# Patient Record
Sex: Female | Born: 1995 | Race: Black or African American | Hispanic: No | Marital: Single | Smoking: Never smoker
Health system: Southern US, Community
[De-identification: ages and names within clinical notes are randomized; demographics above are authoritative.]

## PROBLEM LIST (undated history)

## (undated) DIAGNOSIS — E669 Obesity, unspecified: Secondary | ICD-10-CM

## (undated) DIAGNOSIS — J020 Streptococcal pharyngitis: Secondary | ICD-10-CM

## (undated) HISTORY — DX: Obesity, unspecified: E66.9

---
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
## 2016-03-22 ENCOUNTER — Encounter: Admitting: Family Medicine

## 2016-03-30 ENCOUNTER — Encounter: Payer: Self-pay | Admitting: Family Medicine

## 2016-03-30 ENCOUNTER — Ambulatory Visit (INDEPENDENT_AMBULATORY_CARE_PROVIDER_SITE_OTHER): Admitting: Family Medicine

## 2016-03-30 VITALS — BP 122/88 | HR 76 | Temp 97.9°F | Ht 67.0 in | Wt 371.0 lb

## 2016-03-30 DIAGNOSIS — Z23 Encounter for immunization: Secondary | ICD-10-CM

## 2016-03-30 DIAGNOSIS — F329 Major depressive disorder, single episode, unspecified: Secondary | ICD-10-CM

## 2016-03-30 DIAGNOSIS — F32A Depression, unspecified: Secondary | ICD-10-CM

## 2016-03-30 DIAGNOSIS — Z0000 Encounter for general adult medical examination without abnormal findings: Secondary | ICD-10-CM | POA: Diagnosis not present

## 2016-03-30 DIAGNOSIS — Z114 Encounter for screening for human immunodeficiency virus [HIV]: Secondary | ICD-10-CM

## 2016-03-30 DIAGNOSIS — N946 Dysmenorrhea, unspecified: Secondary | ICD-10-CM

## 2016-03-30 DIAGNOSIS — Z803 Family history of malignant neoplasm of breast: Secondary | ICD-10-CM | POA: Diagnosis not present

## 2016-03-30 MED ORDER — VENLAFAXINE HCL 37.5 MG PO TABS: 37.5000 mg | ORAL_TABLET | Freq: Two times a day (BID) | ORAL | 5 refills | Status: DC

## 2016-03-30 NOTE — Patient Instructions (Addendum)
It was nice to meet you today!  On your way out, schedule an appointment one morning to come back for fasting labs. Do not eat or drink anything other than water the morning of your lab appointment until after your labs are drawn.  Will check cholesterol, liver, kidneys, HIV, and diabetes.  Follow up with me to talk about birth control options if you decide this is what you want.  Refilled effexor  Referring to genetic counselor to discuss your family history of cancer.  Be well, Dr. Ardelia Mems     Contraception Choices Contraception (birth control) is the use of any methods or devices to prevent pregnancy. Below are some methods to help avoid pregnancy. Hormonal methods  Contraceptive implant. This is a thin, plastic tube containing progesterone hormone. It does not contain estrogen hormone. Your health care provider inserts the tube in the inner part of the upper arm. The tube can remain in place for up to 3 years. After 3 years, the implant must be removed. The implant prevents the ovaries from releasing an egg (ovulation), thickens the cervical mucus to prevent sperm from entering the uterus, and thins the lining of the inside of the uterus.  Progesterone-only injections. These injections are given every 3 months by your health care provider to prevent pregnancy. This synthetic progesterone hormone stops the ovaries from releasing eggs. It also thickens cervical mucus and changes the uterine lining. This makes it harder for sperm to survive in the uterus.  Birth control pills. These pills contain estrogen and progesterone hormone. They work by preventing the ovaries from releasing eggs (ovulation). They also cause the cervical mucus to thicken, preventing the sperm from entering the uterus. Birth control pills are prescribed by a health care provider.Birth control pills can also be used to treat heavy periods.  Minipill. This type of birth control pill contains only the progesterone  hormone. They are taken every day of each month and must be prescribed by your health care provider.  Birth control patch. The patch contains hormones similar to those in birth control pills. It must be changed once a week and is prescribed by a health care provider.  Vaginal ring. The ring contains hormones similar to those in birth control pills. It is left in the vagina for 3 weeks, removed for 1 week, and then a new one is put back in place. The patient must be comfortable inserting and removing the ring from the vagina.A health care provider's prescription is necessary.  Emergency contraception. Emergency contraceptives prevent pregnancy after unprotected sexual intercourse. This pill can be taken right after sex or up to 5 days after unprotected sex. It is most effective the sooner you take the pills after having sexual intercourse. Most emergency contraceptive pills are available without a prescription. Check with your pharmacist. Do not use emergency contraception as your only form of birth control. Barrier methods  Female condom. This is a thin sheath (latex or rubber) that is worn over the penis during sexual intercourse. It can be used with spermicide to increase effectiveness.  Female condom. This is a soft, loose-fitting sheath that is put into the vagina before sexual intercourse.  Diaphragm. This is a soft, latex, dome-shaped barrier that must be fitted by a health care provider. It is inserted into the vagina, along with a spermicidal jelly. It is inserted before intercourse. The diaphragm should be left in the vagina for 6 to 8 hours after intercourse.  Cervical cap. This is a round, soft, latex  or plastic cup that fits over the cervix and must be fitted by a health care provider. The cap can be left in place for up to 48 hours after intercourse.  Sponge. This is a soft, circular piece of polyurethane foam. The sponge has spermicide in it. It is inserted into the vagina after wetting  it and before sexual intercourse.  Spermicides. These are chemicals that kill or block sperm from entering the cervix and uterus. They come in the form of creams, jellies, suppositories, foam, or tablets. They do not require a prescription. They are inserted into the vagina with an applicator before having sexual intercourse. The process must be repeated every time you have sexual intercourse. Intrauterine contraception  Intrauterine device (IUD). This is a T-shaped device that is put in a woman's uterus during a menstrual period to prevent pregnancy. There are 2 types:  Copper IUD. This type of IUD is wrapped in copper wire and is placed inside the uterus. Copper makes the uterus and fallopian tubes produce a fluid that kills sperm. It can stay in place for 10 years.  Hormone IUD. This type of IUD contains the hormone progestin (synthetic progesterone). The hormone thickens the cervical mucus and prevents sperm from entering the uterus, and it also thins the uterine lining to prevent implantation of a fertilized egg. The hormone can weaken or kill the sperm that get into the uterus. It can stay in place for 3-5 years, depending on which type of IUD is used. Permanent methods of contraception  Female tubal ligation. This is when the woman's fallopian tubes are surgically sealed, tied, or blocked to prevent the egg from traveling to the uterus.  Hysteroscopic sterilization. This involves placing a small coil or insert into each fallopian tube. Your doctor uses a technique called hysteroscopy to do the procedure. The device causes scar tissue to form. This results in permanent blockage of the fallopian tubes, so the sperm cannot fertilize the egg. It takes about 3 months after the procedure for the tubes to become blocked. You must use another form of birth control for these 3 months.  Female sterilization. This is when the female has the tubes that carry sperm tied off (vasectomy).This blocks sperm from  entering the vagina during sexual intercourse. After the procedure, the man can still ejaculate fluid (semen). Natural planning methods  Natural family planning. This is not having sexual intercourse or using a barrier method (condom, diaphragm, cervical cap) on days the woman could become pregnant.  Calendar method. This is keeping track of the length of each menstrual cycle and identifying when you are fertile.  Ovulation method. This is avoiding sexual intercourse during ovulation.  Symptothermal method. This is avoiding sexual intercourse during ovulation, using a thermometer and ovulation symptoms.  Post-ovulation method. This is timing sexual intercourse after you have ovulated. Regardless of which type or method of contraception you choose, it is important that you use condoms to protect against the transmission of sexually transmitted infections (STIs). Talk with your health care provider about which form of contraception is most appropriate for you. This information is not intended to replace advice given to you by your health care provider. Make sure you discuss any questions you have with your health care provider. Document Released: 02/22/2005 Document Revised: 07/31/2015 Document Reviewed: 08/17/2012 Elsevier Interactive Patient Education  2017 [place] Maintenance, Female Introduction Adopting a healthy lifestyle and getting preventive care can go a long way to promote health and wellness.  Talk with your health care provider about what schedule of regular examinations is right for you. This is a good chance for you to check in with your provider about disease prevention and staying healthy. In between checkups, there are plenty of things you can do on your own. Experts have done a lot of research about which lifestyle changes and preventive measures are most likely to keep you healthy. Ask your health care provider for more information. Weight and diet Eat a  healthy diet  Be sure to include plenty of vegetables, fruits, low-fat dairy products, and lean protein.  Do not eat a lot of foods high in solid fats, added sugars, or salt.  Get regular exercise. This is one of the most important things you can do for your health.  Most adults should exercise for at least 150 minutes each week. The exercise should increase your heart rate and make you sweat (moderate-intensity exercise).  Most adults should also do strengthening exercises at least twice a week. This is in addition to the moderate-intensity exercise. Maintain a healthy weight  Body mass index (BMI) is a measurement that can be used to identify possible weight problems. It estimates body fat based on height and weight. Your health care provider can help determine your BMI and help you achieve or maintain a healthy weight.  For females 52 years of age and older:  A BMI below 18.5 is considered underweight.  A BMI of 18.5 to 24.9 is normal.  A BMI of 25 to 29.9 is considered overweight.  A BMI of 30 and above is considered obese. Watch levels of cholesterol and blood lipids  You should start having your blood tested for lipids and cholesterol at 20 years of age, then have this test every 5 years.  You may need to have your cholesterol levels checked more often if:  Your lipid or cholesterol levels are high.  You are older than 20 years of age.  You are at high risk for heart disease. Cancer screening Lung Cancer  Lung cancer screening is recommended for adults 16-4 years old who are at high risk for lung cancer because of a history of smoking.  A yearly low-dose CT scan of the lungs is recommended for people who:  Currently smoke.  Have quit within the past 15 years.  Have at least a 30-pack-year history of smoking. A pack year is smoking an average of one pack of cigarettes a day for 1 year.  Yearly screening should continue until it has been 15 years since you  quit.  Yearly screening should stop if you develop a health problem that would prevent you from having lung cancer treatment. Breast Cancer  Practice breast self-awareness. This means understanding how your breasts normally appear and feel.  It also means doing regular breast self-exams. Let your health care provider know about any changes, no matter how small.  If you are in your 20s or 30s, you should have a clinical breast exam (CBE) by a health care provider every 1-3 years as part of a regular health exam.  If you are 89 or older, have a CBE every year. Also consider having a breast X-ray (mammogram) every year.  If you have a family history of breast cancer, talk to your health care provider about genetic screening.  If you are at high risk for breast cancer, talk to your health care provider about having an MRI and a mammogram every year.  Breast cancer gene (BRCA) assessment  is recommended for women who have family members with BRCA-related cancers. BRCA-related cancers include:  Breast.  Ovarian.  Tubal.  Peritoneal cancers.  Results of the assessment will determine the need for genetic counseling and BRCA1 and BRCA2 testing. Cervical Cancer  Your health care provider may recommend that you be screened regularly for cancer of the pelvic organs (ovaries, uterus, and vagina). This screening involves a pelvic examination, including checking for microscopic changes to the surface of your cervix (Pap test). You may be encouraged to have this screening done every 3 years, beginning at age 80.  For women ages 25-65, health care providers may recommend pelvic exams and Pap testing every 3 years, or they may recommend the Pap and pelvic exam, combined with testing for human papilloma virus (HPV), every 5 years. Some types of HPV increase your risk of cervical cancer. Testing for HPV may also be done on women of any age with unclear Pap test results.  Other health care providers may  not recommend any screening for nonpregnant women who are considered low risk for pelvic cancer and who do not have symptoms. Ask your health care provider if a screening pelvic exam is right for you.  If you have had past treatment for cervical cancer or a condition that could lead to cancer, you need Pap tests and screening for cancer for at least 20 years after your treatment. If Pap tests have been discontinued, your risk factors (such as having a new sexual partner) need to be reassessed to determine if screening should resume. Some women have medical problems that increase the chance of getting cervical cancer. In these cases, your health care provider may recommend more frequent screening and Pap tests. Colorectal Cancer  This type of cancer can be detected and often prevented.  Routine colorectal cancer screening usually begins at 20 years of age and continues through 20 years of age.  Your health care provider may recommend screening at an earlier age if you have risk factors for colon cancer.  Your health care provider may also recommend using home test kits to check for hidden blood in the stool.  A small camera at the end of a tube can be used to examine your colon directly (sigmoidoscopy or colonoscopy). This is done to check for the earliest forms of colorectal cancer.  Routine screening usually begins at age 57.  Direct examination of the colon should be repeated every 5-10 years through 20 years of age. However, you may need to be screened more often if early forms of precancerous polyps or small growths are found. Skin Cancer  Check your skin from head to toe regularly.  Tell your health care provider about any new moles or changes in moles, especially if there is a change in a mole's shape or color.  Also tell your health care provider if you have a mole that is larger than the size of a pencil eraser.  Always use sunscreen. Apply sunscreen liberally and repeatedly  throughout the day.  Protect yourself by wearing long sleeves, pants, a wide-brimmed hat, and sunglasses whenever you are outside. Heart disease, diabetes, and high blood pressure  High blood pressure causes heart disease and increases the risk of stroke. High blood pressure is more likely to develop in:  People who have blood pressure in the high end of the normal range (130-139/85-89 mm Hg).  People who are overweight or obese.  People who are African American.  If you are 79-32 years of age,  have your blood pressure checked every 3-5 years. If you are 21 years of age or older, have your blood pressure checked every year. You should have your blood pressure measured twice-once when you are at a hospital or clinic, and once when you are not at a hospital or clinic. Record the average of the two measurements. To check your blood pressure when you are not at a hospital or clinic, you can use:  An automated blood pressure machine at a pharmacy.  A home blood pressure monitor.  If you are between 18 years and 53 years old, ask your health care provider if you should take aspirin to prevent strokes.  Have regular diabetes screenings. This involves taking a blood sample to check your fasting blood sugar level.  If you are at a normal weight and have a low risk for diabetes, have this test once every three years after 20 years of age.  If you are overweight and have a high risk for diabetes, consider being tested at a younger age or more often. Preventing infection Hepatitis B  If you have a higher risk for hepatitis B, you should be screened for this virus. You are considered at high risk for hepatitis B if:  You were born in a country where hepatitis B is common. Ask your health care provider which countries are considered high risk.  Your parents were born in a high-risk country, and you have not been immunized against hepatitis B (hepatitis B vaccine).  You have HIV or AIDS.  You  use needles to inject street drugs.  You live with someone who has hepatitis B.  You have had sex with someone who has hepatitis B.  You get hemodialysis treatment.  You take certain medicines for conditions, including cancer, organ transplantation, and autoimmune conditions. Hepatitis C  Blood testing is recommended for:  Everyone born from [birth year] through 1964.  Anyone with known risk factors for hepatitis C. Sexually transmitted infections (STIs)  You should be screened for sexually transmitted infections (STIs) including gonorrhea and chlamydia if:  You are sexually active and are younger than 20 years of age.  You are older than 20 years of age and your health care provider tells you that you are at risk for this type of infection.  Your sexual activity has changed since you were last screened and you are at an increased risk for chlamydia or gonorrhea. Ask your health care provider if you are at risk.  If you do not have HIV, but are at risk, it may be recommended that you take a prescription medicine daily to prevent HIV infection. This is called pre-exposure prophylaxis (PrEP). You are considered at risk if:  You are sexually active and do not regularly use condoms or know the HIV status of your partner(s).  You take drugs by injection.  You are sexually active with a partner who has HIV. Talk with your health care provider about whether you are at high risk of being infected with HIV. If you choose to begin PrEP, you should first be tested for HIV. You should then be tested every 3 months for as long as you are taking PrEP. Pregnancy  If you are premenopausal and you may become pregnant, ask your health care provider about preconception counseling.  If you may become pregnant, take 400 to 800 micrograms (mcg) of folic acid every day.  If you want to prevent pregnancy, talk to your health care provider about birth control (contraception). Osteoporosis and  menopause  Osteoporosis is a disease in which the bones lose minerals and strength with aging. This can result in serious bone fractures. Your risk for osteoporosis can be identified using a bone density scan.  If you are 23 years of age or older, or if you are at risk for osteoporosis and fractures, ask your health care provider if you should be screened.  Ask your health care provider whether you should take a calcium or vitamin D supplement to lower your risk for osteoporosis.  Menopause may have certain physical symptoms and risks.  Hormone replacement therapy may reduce some of these symptoms and risks. Talk to your health care provider about whether hormone replacement therapy is right for you. Follow these instructions at home:  Schedule regular health, dental, and eye exams.  Stay current with your immunizations.  Do not use any tobacco products including cigarettes, chewing tobacco, or electronic cigarettes.  If you are pregnant, do not drink alcohol.  If you are breastfeeding, limit how much and how often you drink alcohol.  Limit alcohol intake to no more than 1 drink per day for nonpregnant women. One drink equals 12 ounces of beer, 5 ounces of wine, or 1 ounces of hard liquor.  Do not use street drugs.  Do not share needles.  Ask your health care provider for help if you need support or information about quitting drugs.  Tell your health care provider if you often feel depressed.  Tell your health care provider if you have ever been abused or do not feel safe at home. This information is not intended to replace advice given to you by your health care provider. Make sure you discuss any questions you have with your health care provider. Document Released: 09/07/2010 Document Revised: 07/31/2015 Document Reviewed: 11/26/2014  2017 Elsevier

## 2016-03-30 NOTE — Progress Notes (Signed)
Date of Visit: 03/30/2016   HPI:  Patient presents today for a well woman exam. This is my first time meeting her, as she recently was reassigned to me due to changes in Dr. Tyson AliasWalden's availability.  Concerns today: menstrual cramps see below Periods: monthly periods, lasts 5-7 days on average, bad cramps. Has tried midol. Currently on day 3 of period. Wants to see how midol does for this cycle. May want to return to discuss birth control options to help with cramps. Contraception: abstinent, denies any history of sexual activity Pelvic symptoms: no pain (except menstrual cramps) or discharge Sexual activity: no sex - not planning on being sexually active at this time STD Screening: declines STD testing, but ok with routine HIV screening with labs Pap smear status: not indicated due to age Exercise: walks a lot at work Diet: working on healthy eating Smoking: none Alcohol: none Drugs: none Mood: see below Dentist: has not been recently, anticipates she will get dental coverage in the fall and will go then  Depression - history of this, taking venlafaxine 37.5mg  twice daily (apparently her pharmacy gave her multiple bottles of this, despite Dr. Gwendolyn GrantWalden only prescribing 1 month with 1 refill). Has been compliant with taking this. Feels it is helping greatly. No SI/HI. Happy with current dose.   ROS: See HPI  PMFSH:  Cancers in family:  Mom - breast cancer, died when patient was age 656 Maternal grandmother - ovarian Paternal grandmother - breast Patient unsure if any genetic testing was done on her family members. She is interested in seeing a Dentistgenetic counselor.  PHYSICAL EXAM: BP 122/88   Pulse 76   Temp 97.9 F (36.6 C) (Oral)   Ht 5\' 7"  (1.702 m)   Wt (!) 371 lb (168.3 kg)   LMP 03/27/2016   BMI 58.11 kg/m  Gen: NAD, pleasant, cooperative HEENT: NCAT, PERRL, no palpable thyromegaly or anterior cervical lymphadenopathy Heart: RRR, no murmurs Lungs: CTAB, NWOB Abdomen: soft,  nontender to palpation Neuro: grossly nonfocal, speech normal Extremities: No appreciable lower extremity edema bilaterally  Psych: normal range of affect, well groomed, speech normal in rate and volume, normal eye contact   ASSESSMENT/PLAN:  # [place] maintenance:  -STD screening: declined. Check HIV antibody with labs -pap smear: not indicated (age 21) -lipid screening: return for fasting lipids & CMET, A1c given BMI -immunizations: flu shot given today  -handout given on health maintenance topics  Morbid obesity (HCC) Encouraged efforts at losing weight. Return for fasting lipids, CMET, A1c (hx of prediabetes noted on prior labs)  Depression Well controlled on venlafaxine. Continue current medication. Refilled today.  Family history of breast cancer Strong family history of breast & ovarian cancer. Will refer to genetic counseling.  Menstrual cramps after visit summary with handout on contraception options (pt declined actual printed copy, preferred to access via  Mychart) Continue midol Follow up if she decides she wants to do birth control to manage cramps   FOLLOW UP: Follow up in several days for lab visit Referring to genetic counselor  [place]renadaBrittany J. Pollie MeyerMcIntyre, MD [facility]

## 2016-03-31 DIAGNOSIS — N946 Dysmenorrhea, unspecified: Secondary | ICD-10-CM | POA: Insufficient documentation

## 2016-03-31 DIAGNOSIS — Z803 Family history of malignant neoplasm of breast: Secondary | ICD-10-CM | POA: Insufficient documentation

## 2016-03-31 NOTE — Assessment & Plan Note (Signed)
Well controlled on venlafaxine. Continue current medication. Refilled today.

## 2016-03-31 NOTE — Assessment & Plan Note (Signed)
after visit summary with handout on contraception options (pt declined actual printed copy, preferred to access via  Mychart) Continue midol Follow up if she decides she wants to do birth control to manage cramps

## 2016-03-31 NOTE — Assessment & Plan Note (Signed)
Encouraged efforts at losing weight. Return for fasting lipids, CMET, A1c (hx of prediabetes noted on prior labs)

## 2016-03-31 NOTE — Assessment & Plan Note (Signed)
Strong family history of breast & ovarian cancer. Will refer to genetic counseling.

## 2016-04-08 ENCOUNTER — Encounter: Payer: Self-pay | Admitting: Genetic Counselor

## 2016-04-08 ENCOUNTER — Telehealth: Payer: Self-pay | Admitting: Genetic Counselor

## 2016-04-08 NOTE — Telephone Encounter (Signed)
Appt has been scheduled for the pt to see a genetic counselor on 2/23 at 930am. Left appt date and time with the pt's grandmother to inform the pt. Letter mailed.

## 2016-04-27 ENCOUNTER — Encounter

## 2016-04-27 ENCOUNTER — Other Ambulatory Visit

## 2016-06-01 ENCOUNTER — Ambulatory Visit: Admitting: Family Medicine

## 2016-06-09 ENCOUNTER — Ambulatory Visit (INDEPENDENT_AMBULATORY_CARE_PROVIDER_SITE_OTHER): Admitting: Family Medicine

## 2016-06-09 ENCOUNTER — Encounter: Payer: Self-pay | Admitting: Family Medicine

## 2016-06-09 DIAGNOSIS — K529 Noninfective gastroenteritis and colitis, unspecified: Secondary | ICD-10-CM | POA: Insufficient documentation

## 2016-06-09 MED ORDER — ONDANSETRON 4 MG PO TBDP: 4.0000 mg | ORAL_TABLET | Freq: Three times a day (TID) | ORAL | 0 refills | Status: DC | PRN

## 2016-06-09 NOTE — Patient Instructions (Signed)
I am not sure whether this is a stomach virus or food poisoning.  The treatment is the same.  I would assume you are contagious. I sent in a nausea med if you need it. Lots of fluids.   OK to use small doses of Immodium. See [place] if worse, bleeding.

## 2016-06-09 NOTE — Assessment & Plan Note (Signed)
Unclear if food poisoning or infectious.  No red flag symptoms.  Symptomatic treatment.

## 2016-06-09 NOTE — Progress Notes (Signed)
   Subjective:    Patient ID: Kathy [name], female    DOB: Mar 02, 1996, 21 y.o.   MRN: [id number]  HPI 21 yo female with abrupt onset of nausea, vomiting and now diarrhea beginning ~12 hours ago.  She attributes to eating at [employer].  Grandmother had similar problems two days ago and also ate at the same place.  Denies any possibility of pregnancy.  "I am a virgin."  Vomiting has cleared.  Still nausea.  Very frequent (q15 min) watery diarrhea, no blood.  No fever or lightheadedness.  Only cramping Pain, not constant.    Review of Systems     Objective:   Physical Exam VS noted Non toxic appearance. Abd benign. Mild diffuse discomfort.        Assessment & Plan:

## 2018-10-31 ENCOUNTER — Other Ambulatory Visit: Payer: Self-pay

## 2018-10-31 ENCOUNTER — Ambulatory Visit (INDEPENDENT_AMBULATORY_CARE_PROVIDER_SITE_OTHER): Admitting: Family Medicine

## 2018-10-31 VITALS — BP 119/75 | HR 72 | Wt >= 6400 oz

## 2018-10-31 DIAGNOSIS — Z803 Family history of malignant neoplasm of breast: Secondary | ICD-10-CM

## 2018-10-31 DIAGNOSIS — Z8041 Family history of malignant neoplasm of ovary: Secondary | ICD-10-CM | POA: Diagnosis not present

## 2018-10-31 DIAGNOSIS — Z0000 Encounter for general adult medical examination without abnormal findings: Secondary | ICD-10-CM | POA: Diagnosis not present

## 2018-10-31 DIAGNOSIS — N946 Dysmenorrhea, unspecified: Secondary | ICD-10-CM

## 2018-10-31 NOTE — Progress Notes (Signed)
Date of Visit: 10/31/2018   CC: Physical   HPI:  Kathy [name] is a 23 year old female with history of menstrual cramps and obesity and presenting today for her annual physical. Her concerns today include menstrual cramps and weight loss.   She reports that her menstrual cramps have become more noticeable in the last 2 months. Her cycle is regular and predictable but she has noticed more bleeding and more cramping with her last two cycles. She is changing her pads every 2-3 hours. She is not soaking through them but feels comfortable changing them frequently. Midol has not been as helpful with the cramps. Denies nausea, vomiting, or headaches.     She reports losing 18 pounds in the last 2 months. Reports taking "Keto cleanse" and "Keto diet" pills she got online. She has also been exercising and eating healthier. She tried to avoid fast foods and tries to pick healthier options from fast foods. She reports just wanting to be healthier.   No recent changes in her mood or feelings of depression.  Contraception: abstinent Sexual activity: none ever, not anticipating will happen soon STD Screening: declines Pap smear status: will schedule at another time Exercise: been more active lately Diet: see above Smoking: no Alcohol: no Drugs: no  ROS: See HPI.  [place]: +family history of breast cancer in multiple female family members  PHYSICAL EXAM: BP 119/75   Pulse 72   Wt (!) 445 lb 9.6 oz (202.1 kg)   SpO2 99%   BMI 69.79 kg/m  Gen: Well- appearing, pleasant female in no acute distress HEENT:  Normocephalic, atraumatic No cervical LAD.  Heart: S1S2 RRR with out m/r/g.  Lungs: CTAB. No wheezing or crackles. Good work of breathing. Abd: Soft non- distended abdomen, No tenderness or guarding to palpation.  Ext: No lower extremity edema bilaterally   ASSESSMENT/PLAN:  Health maintenance:  - patient to schedule pap for another visit (she had a job interview today and cannot stay any  longer for it) - check CBC, CMET, lipids  Morbid obesity ([place]) Long history of obesity. Lost 18 pounds in the last two months with eating healthier, exercising and OTC diet pills. Counseled on continuing to set goals to eat healthy and to exercise regularly. Safety on OTC diet pills not known, encouraged to stop taking. Referral to [facility].   Menstrual cramps Cycles are regular but reports more bleeding and cramping with last two cycles. Pain not controlled with Midol. Patient can take prophylactic NSAIDs for pain a day before her 1st day of menstruation. Discussed OCP with patient. Interested in trying the prophylactic method and following up if no difference in pain.   Family history of breast cancer Referring back to genetic counseling for strong family history of breast cancer.  FOLLOW UP: Follow up if no improvement with menstrual cramps.  Schedule pap   Vernice Jefferson MS3, Medical Student   Patient seen along with MS3 student Vernice Jefferson. I personally evaluated this patient along with the student, and verified all aspects of the history, physical exam, and medical decision making as documented by the student. I agree with the student's documentation and have made all necessary edits.  Chrisandra Netters, MD [place]

## 2018-10-31 NOTE — Patient Instructions (Signed)
Try taking ibuprofen or naproxen the day before your period  Call the [facility] to set up an appointment for weight management. Their number is [phone].   Reschedule appointment for pap smear  Referring to genetic counselor  Be well, Dr. Ardelia Mems    Health Maintenance, Female Adopting a healthy lifestyle and getting preventive care are important in promoting health and wellness. Ask your health care provider about:  The right schedule for you to have regular tests and exams.  Things you can do on your own to prevent diseases and keep yourself healthy. What should I know about diet, weight, and exercise? Eat a healthy diet   Eat a diet that includes plenty of vegetables, fruits, low-fat dairy products, and lean protein.  Do not eat a lot of foods that are high in solid fats, added sugars, or sodium. Maintain a healthy weight Body mass index (BMI) is used to identify weight problems. It estimates body fat based on height and weight. Your health care provider can help determine your BMI and help you achieve or maintain a healthy weight. Get regular exercise Get regular exercise. This is one of the most important things you can do for your health. Most adults should:  Exercise for at least 150 minutes each week. The exercise should increase your heart rate and make you sweat (moderate-intensity exercise).  Do strengthening exercises at least twice a week. This is in addition to the moderate-intensity exercise.  Spend less time sitting. Even light physical activity can be beneficial. Watch cholesterol and blood lipids Have your blood tested for lipids and cholesterol at 22 years of age, then have this test every 5 years. Have your cholesterol levels checked more often if:  Your lipid or cholesterol levels are high.  You are older than 22 years of age.  You are at high risk for heart disease. What should I know about cancer screening?  Depending on your health history and family history, you may need to have cancer screening at various ages. This may include screening for:  Breast cancer.  Cervical cancer.  Colorectal cancer.  Skin cancer.  Lung cancer. What should I know about heart disease, diabetes, and high blood pressure? Blood pressure and heart disease  High blood pressure causes heart disease and increases the risk of stroke. This is more likely to develop in people who have high blood pressure readings, are of African descent, or are overweight.  Have your blood pressure checked: ? Every 3-5 years if you are 69-26 years of age. ? Every year if you are 58 years old or older. Diabetes Have regular diabetes screenings. This checks your fasting blood sugar level. Have the screening done:  Once every three years after age 3 if you are at a normal weight and have a low risk for diabetes.  More often and at a younger age if you are overweight or have a high risk for diabetes. What should I know about preventing infection? Hepatitis B If you have a higher risk for hepatitis B, you should be screened for this virus. Talk with your health care provider to find out if you are at risk for hepatitis B infection. Hepatitis C Testing is recommended for:  Everyone born from [birth year] through 1964.  Anyone with known risk factors for hepatitis C. Sexually transmitted infections (STIs)  Get screened for STIs, including gonorrhea and chlamydia, if: ? You are sexually active and are younger than 22 years of age. ?  You are older than 22 years of age and your health care provider tells you that you are at risk for this type of infection. ? Your sexual activity has changed since you were last screened, and you are at increased risk for chlamydia or gonorrhea. Ask your health care provider if you are at risk.  Ask your health care provider about whether you are at high risk for HIV. Your health care provider may recommend a  prescription medicine to help prevent HIV infection. If you choose to take medicine to prevent HIV, you should first get tested for HIV. You should then be tested every 3 months for as long as you are taking the medicine. Pregnancy  If you are about to stop having your period (premenopausal) and you may become pregnant, seek counseling before you get pregnant.  Take 400 to 800 micrograms (mcg) of folic acid every day if you become pregnant.  Ask for birth control (contraception) if you want to prevent pregnancy. Osteoporosis and menopause Osteoporosis is a disease in which the bones lose minerals and strength with aging. This can result in bone fractures. If you are 33 years old or older, or if you are at risk for osteoporosis and fractures, ask your health care provider if you should:  Be screened for bone loss.  Take a calcium or vitamin D supplement to lower your risk of fractures.  Be given hormone replacement therapy (HRT) to treat symptoms of menopause. Follow these instructions at home: Lifestyle  Do not use any products that contain nicotine or tobacco, such as cigarettes, e-cigarettes, and chewing tobacco. If you need help quitting, ask your health care provider.  Do not use street drugs.  Do not share needles.  Ask your health care provider for help if you need support or information about quitting drugs. Alcohol use  Do not drink alcohol if: ? Your health care provider tells you not to drink. ? You are pregnant, may be pregnant, or are planning to become pregnant.  If you drink alcohol: ? Limit how much you use to 0-1 drink a day. ? Limit intake if you are breastfeeding.  Be aware of how much alcohol is in your drink. In the [place] one drink equals one 12 oz bottle of beer (355 mL), one 5 oz glass of wine (148 mL), or one 1 oz glass of hard liquor (44 mL). General instructions  Schedule regular health, dental, and eye exams.  Stay current with your vaccines.   Tell your health care provider if: ? You often feel depressed. ? You have ever been abused or do not feel safe at home. Summary  Adopting a healthy lifestyle and getting preventive care are important in promoting health and wellness.  Follow your health care provider's instructions about healthy diet, exercising, and getting tested or screened for diseases.  Follow your health care provider's instructions on monitoring your cholesterol and blood pressure. This information is not intended to replace advice given to you by your health care provider. Make sure you discuss any questions you have with your health care provider. Document Released: 09/07/2010 Document Revised: 02/15/2018 Document Reviewed: 02/15/2018 Elsevier Patient Education  2020 [employer].

## 2018-10-31 NOTE — Assessment & Plan Note (Addendum)
Long history of obesity. Lost 18 pounds in the last two months with eating healthier, exercising and OTC diet pills. Counseled on continuing to set goals to eat healthy and to exercise regularly. Safety on OTC diet pills not known, encouraged to stop taking. Referral to [facility].

## 2018-10-31 NOTE — Assessment & Plan Note (Addendum)
Cycles are regular but reports more bleeding and cramping with last two cycles. Pain not controlled with Midol. Patient can take prophylactic NSAIDs for pain a day before her 1st day of menstruation. Discussed OCP with patient. Interested in trying the prophylactic method and following up if no difference in pain.

## 2018-11-01 LAB — CMP14+EGFR
ALT: 22 IU/L (ref 0–32)
AST: 14 IU/L (ref 0–40)
Albumin/Globulin Ratio: 1.6 (ref 1.2–2.2)
Albumin: 4.1 g/dL (ref 3.9–5.0)
Alkaline Phosphatase: 73 IU/L (ref 39–117)
BUN/Creatinine Ratio: 18 (ref 9–23)
BUN: 14 mg/dL (ref 6–20)
Bilirubin Total: 0.3 mg/dL (ref 0.0–1.2)
CO2: 22 mmol/L (ref 20–29)
Calcium: 9.2 mg/dL (ref 8.7–10.2)
Chloride: 102 mmol/L (ref 96–106)
Creatinine, Ser: 0.76 mg/dL (ref 0.57–1.00)
GFR calc Af Amer: 129 mL/min/{1.73_m2} (ref >59)
GFR calc non Af Amer: 112 mL/min/{1.73_m2} (ref >59)
Globulin, Total: 2.5 g/dL (ref 1.5–4.5)
Glucose: 91 mg/dL (ref 65–99)
Potassium: 4.4 mmol/L (ref 3.5–5.2)
Sodium: 138 mmol/L (ref 134–144)
Total Protein: 6.6 g/dL (ref 6.0–8.5)

## 2018-11-01 LAB — CBC
Hematocrit: 38 % (ref 34.0–46.6)
Hemoglobin: 12.6 g/dL (ref 11.1–15.9)
MCH: 26.8 pg (ref 26.6–33.0)
MCHC: 33.2 g/dL (ref 31.5–35.7)
MCV: 81 fL (ref 79–97)
Platelets: 312 10*3/uL (ref 150–450)
RBC: 4.71 x10E6/uL (ref 3.77–5.28)
RDW: 14.1 % (ref 11.7–15.4)
WBC: 7.2 10*3/uL (ref 3.4–10.8)

## 2018-11-01 LAB — LIPID PANEL
Chol/HDL Ratio: 2.7 ratio (ref 0.0–4.4)
Cholesterol, Total: 166 mg/dL (ref 100–199)
HDL: 61 mg/dL (ref >39)
LDL Calculated: 92 mg/dL (ref 0–99)
Triglycerides: 66 mg/dL (ref 0–149)
VLDL Cholesterol Cal: 13 mg/dL (ref 5–40)

## 2018-11-08 ENCOUNTER — Encounter: Payer: Self-pay | Admitting: Family Medicine

## 2018-11-08 NOTE — Assessment & Plan Note (Signed)
Referring back to genetic counseling for strong family history of breast cancer.

## 2018-11-29 ENCOUNTER — Ambulatory Visit (INDEPENDENT_AMBULATORY_CARE_PROVIDER_SITE_OTHER): Admitting: Family Medicine

## 2018-11-29 ENCOUNTER — Other Ambulatory Visit: Payer: Self-pay

## 2018-11-29 VITALS — BP 118/74 | HR 101 | Wt >= 6400 oz

## 2018-11-29 DIAGNOSIS — N920 Excessive and frequent menstruation with regular cycle: Secondary | ICD-10-CM

## 2018-11-29 MED ORDER — MEDROXYPROGESTERONE ACETATE 10 MG PO TABS: 20.0000 mg | ORAL_TABLET | Freq: Three times a day (TID) | ORAL | 0 refills | Status: DC

## 2018-11-29 NOTE — Patient Instructions (Addendum)
Dear Kathy [name],   It was good to see you! Thank you for taking your time to come in to be seen. Today, we discussed the following:   Irregular Bleeding    I am low to start you on a progesterone (hormone) pill to take 3 times a day for 7 days.  This is going to help you stop bleeding.  There is a chance that you will start bleeding again after coming off of the medication.  Mostly, I think this is due to endogenous estrogen secondary to increased adipose tissue.  Long-term, weight loss will likely be the most helpful intervention if you continue to have irregular bleeding.  Please follow-up with [place] if you continue to have irregular bleeding or start to feel dizzy or faint.  We can also continue other work-up as there are many causes of irregular bleeding, but given your history, this is likely due to a hormonal cause.    Be well,   Zettie Cooley, M.D   [facility] [phone]  *Sign up for MyChart for instant access to your health profile, labs, orders, upcoming appointments or to contact your provider with questions*  =================================================================================== = Abnormal Uterine Bleeding Abnormal uterine bleeding is unusual bleeding from the uterus. It includes:  Bleeding or spotting between periods.  Bleeding after sex.  Bleeding that is heavier than normal.  Periods that last longer than usual.  Bleeding after menopause. Abnormal uterine bleeding can affect women at various stages in life, including teenagers, women in their reproductive years, pregnant women, and women who have reached menopause. Common causes of abnormal uterine bleeding include:  Pregnancy.  Growths of tissue (polyps).  A noncancerous tumor in the uterus (fibroid).  Infection.  Cancer.  Hormonal imbalances. Any type of abnormal bleeding should be evaluated by a health [name] provider. Many cases are minor and simple to treat, while  others are more serious. Treatment will depend on the cause of the bleeding. Follow these instructions at home:  Monitor your condition for any changes.  Do not use tampons, douche, or have sex if told by your health [name] provider.  Change your pads often.  Get regular exams that include pelvic exams and cervical cancer screening.  Keep all follow-up visits as told by your health [name] provider. This is important. Contact a health [name] provider if:  Your bleeding lasts for more than one week.  You feel dizzy at times.  You feel nauseous or you vomit. Get help right away if:  You pass out.  Your bleeding soaks through a pad every hour.  You have abdominal pain.  You have a fever.  You become sweaty or weak.  You pass large blood clots from your vagina. Summary  Abnormal uterine bleeding is unusual bleeding from the uterus.  Any type of abnormal bleeding should be evaluated by a health [name] provider. Many cases are minor and simple to treat, while others are more serious.  Treatment will depend on the cause of the bleeding. This information is not intended to replace advice given to you by your health [name] provider. Make sure you discuss any questions you have with your health [name] provider. Document Released: 02/22/2005 Document Revised: 06/01/2017 Document Reviewed: 03/26/2016 Elsevier Patient Education  [employer].  Dysfunctional Uterine Bleeding Dysfunctional uterine bleeding is abnormal bleeding from the uterus. Dysfunctional uterine bleeding includes:  A menstrual period that comes earlier or later than usual.  A menstrual period that is lighter or heavier than usual,  or has large blood clots.  Vaginal bleeding between menstrual periods.  Skipping one or more menstrual periods.  Vaginal bleeding after sex.  Vaginal bleeding after menopause. Follow these instructions at home: Eating and drinking   Eat well-balanced meals. Include foods that are  high in iron, such as liver, meat, shellfish, green leafy vegetables, and eggs.  To prevent or treat constipation, your health [name] provider may recommend that you: ? Drink enough fluid to keep your urine pale yellow. ? Take over-the-counter or prescription medicines. ? Eat foods that are high in fiber, such as beans, whole grains, and fresh fruits and vegetables. ? Limit foods that are high in fat and processed sugars, such as fried or sweet foods. Medicines  Take over-the-counter and prescription medicines only as told by your health [name] provider.  Do not change medicines without talking with your health [name] provider.  Aspirin or medicines that contain aspirin may make the bleeding worse. Do not take those medicines: ? During the week before your menstrual period. ? During your menstrual period.  If you were prescribed iron pills, take them as told by your health [name] provider. Iron pills help to replace iron that your body loses because of this condition. Activity  If you need to change your sanitary pad or tampon more than one time every 2 hours: ? Lie in bed with your feet raised (elevated). ? Place a cold pack on your lower abdomen. ? Rest as much as possible until the bleeding stops or slows down.  Do not try to lose weight until the bleeding has stopped and your blood iron level is back to normal. General instructions   For two months, write down: ? When your menstrual period starts. ? When your menstrual period ends. ? When any abnormal vaginal bleeding occurs. ? What problems you notice.  Keep all follow up visits as told by your health [name] provider. This is important. Contact a health [name] provider if you:  Feel light-headed or weak.  Have nausea and vomiting.  Cannot eat or drink without vomiting.  Feel dizzy or have diarrhea while you are taking medicines.  Are taking birth control pills or hormones, and you want to change them or stop taking them. Get  help right away if:  You develop a fever or chills.  You need to change your sanitary pad or tampon more than one time per hour.  Your vaginal bleeding becomes heavier, or your flow contains clots more often.  You develop pain in your abdomen.  You lose consciousness.  You develop a rash. Summary  Dysfunctional uterine bleeding is abnormal bleeding from the uterus.  It includes menstrual bleeding of abnormal duration, volume, or regularity.  Bleeding after sex and after menopause are also considered dysfunctional uterine bleeding. This information is not intended to replace advice given to you by your health [name] provider. Make sure you discuss any questions you have with your health [name] provider. Document Released: 02/20/2000 Document Revised: 08/03/2017 Document Reviewed: 08/03/2017 Elsevier Patient Education  2020 [employer].

## 2018-11-29 NOTE — Progress Notes (Signed)
      Subjective  CC: Vaginal Bleeding  [id number] Kathy [name] is Kathy 23 y.o. female who presents today with the following problems:  Vaginal Bleeding x 2 weeks Usual normal periods, once Kathy month, usually last about 7 days with PMS on first three days of bleeding. LMP was approximately 11/10/18. Patient continues to have flow that feels like Kathy normal period since 11/10/18. Her last 1-2 periods were heavy and she saw Dr. Ardelia Mems for this. Tried to use midol and tylenol which helped with PMS.Talked about birth control, unfortunately, patient did not have chance to stay and discuss.  Denies dizziness, lightheaded, fatigue, no cramping. Not sexually active.   Pertinent PMFSHx: Obesity. Neg hx: hirsuitism, acne  ROS: Pertinent ROS included in HPI.    Objective  Physical Exam:  BP 118/74   Pulse (!) 101   Wt (!) 437 lb 6.4 oz (198.4 kg)   LMP 11/12/2018 (Approximate)   SpO2 100%   BMI 68.51 kg/m  General: NAD, non-toxic, obese African American female. HEENT: [place]/AT. PERRLA. EOMI.  Cardiovascular: RRR, normal S1, S2. B/L 2+ RP. No BLEE Respiratory: CTAB. No IWOB.  Abdomen: + BS. NT, ND, soft to palpation.  Extremities: Warm and well perfused. Moving spontaneously.  Integumentary: No obvious rashes, lesions, trauma on general exam. GU: External vulva and vagina nonerythematous, without any obvious lesions or rash.  No discharge appreciated. Normal ruggae of vaginal walls.  Normal hymen, but unable to observe cervix due to inability to pass speculum. Cervix palpated with one finger on bimanual exam. Unable to palpate uterine fundus 2/2 patient's body habitus.      Assessment & Plan    Problem List Items Addressed This Visit      Unprioritized   Polymenorrhea - Primary    Unclear cause.  Most likely due to endogenous estrogen secondary to increased adipose tissue.  PCOS less likely as patient does not have any hirsute features, has regular menstrual cycles.  Low dose progesterone TID x 7 days to  help stop bleeding from last menstrual cycle.  - discussed long term weight loss  -Anemia precautions provided -Further work-up if no improvement with progesterone         Wilber Oliphant, M.D.  PGY-2  Family Medicine  [phone] 12/02/2018 9:56 AM

## 2018-12-02 DIAGNOSIS — N920 Excessive and frequent menstruation with regular cycle: Secondary | ICD-10-CM | POA: Insufficient documentation

## 2018-12-02 NOTE — Assessment & Plan Note (Signed)
Unclear cause.  Most likely due to endogenous estrogen secondary to increased adipose tissue.  PCOS less likely as patient does not have any hirsute features, has regular menstrual cycles.  Low dose progesterone TID x 7 days to help stop bleeding from last menstrual cycle.  - discussed long term weight loss  -Anemia precautions provided -Further work-up if no improvement with progesterone

## 2018-12-08 ENCOUNTER — Other Ambulatory Visit: Payer: Self-pay

## 2018-12-18 ENCOUNTER — Other Ambulatory Visit: Payer: Self-pay

## 2018-12-18 ENCOUNTER — Encounter: Payer: Self-pay | Admitting: Family Medicine

## 2018-12-18 ENCOUNTER — Ambulatory Visit (INDEPENDENT_AMBULATORY_CARE_PROVIDER_SITE_OTHER): Admitting: Family Medicine

## 2018-12-18 VITALS — BP 134/80 | HR 115

## 2018-12-18 DIAGNOSIS — N939 Abnormal uterine and vaginal bleeding, unspecified: Secondary | ICD-10-CM

## 2018-12-18 DIAGNOSIS — N920 Excessive and frequent menstruation with regular cycle: Secondary | ICD-10-CM

## 2018-12-18 LAB — POCT URINE PREGNANCY: Preg Test, Ur: NEGATIVE

## 2018-12-18 NOTE — Progress Notes (Signed)
Date of Visit: 12/18/2018   HPI:  Leighana presents for follow up of AUB.  In June, July, and August she had normal periods lasting 5-7 days each. Then on 9/4 started bleeding and continued bleeding until she saw Dr. Maudie Mercury on 9/23. Took progesterone for 7 days and had immediate cessation of bleeding. Then 2-3 days later began to bleed again. So has been bleeding for 9 days now. Feels more like a regular cycle now, with it being initially heavy and crampy, and then is now lighter. Changes a thin pad 2-3 times per day for sanitary reasons, does not get full.  Of note has a strong family history of breast cancer. Denies shortness of breath, dizziness, lightheadedness. Has never been sexually active.  Was feeling somewhat depressed with increased stress last month. Feelings have now improved. No SI/HI.   ROS: See HPI.  [place]: strong family history of breast ca, obesity, depression  PHYSICAL EXAM: BP 134/80   Pulse (!) 115   SpO2 98%  Gen: no acute distress, pleasant, cooperative HEENT: normocephalic, atraumatic  ASSESSMENT/PLAN:  Health maintenance:  -pap deferred as patient never sexually active and could not tolerate speculum at last visit  Polymenorrhea Improved initially with provera, and has now returned though is lighter. Possibly related to obesity vs stress. Would ideally like to obtain ultrasound to evaluate uterine anatomy, but I do not think patient would tolerate transvaginal ultrasound and her body habitus limits the utility of transabdominal pelvic ultrasound.   Discussed options of watchful waiting vs continued hormonal therapy with patient. Complicating her history is a strong family history of breast and ovarian cancer, so I am hesitant to prescribe any estrogen-containing forms of contraception. After discussing with patient, we opted to refer to GYN for further evaluation and management. Referral entered.  FOLLOW UP: Referring to GYN for further management  [place]  Ardelia Mems, [place]

## 2018-12-18 NOTE — Patient Instructions (Signed)
I am referring you to gynecology for your bleeding. You will get a phone call to schedule this appointment.   Be well, Dr. Ardelia Mems

## 2018-12-20 NOTE — Assessment & Plan Note (Addendum)
Improved initially with provera, and has now returned though is lighter. Possibly related to obesity vs stress. Would ideally like to obtain ultrasound to evaluate uterine anatomy, but I do not think patient would tolerate transvaginal ultrasound and her body habitus limits the utility of transabdominal pelvic ultrasound.   Discussed options of watchful waiting vs continued hormonal therapy with patient. Complicating her history is a strong family history of breast and ovarian cancer, so I am hesitant to prescribe any estrogen-containing forms of contraception. After discussing with patient, we opted to refer to GYN for further evaluation and management. Referral entered.

## 2018-12-22 ENCOUNTER — Telehealth: Payer: Self-pay | Admitting: *Deleted

## 2018-12-22 NOTE — Telephone Encounter (Signed)
Pt states that she is still bleeding and she wants to know if she can have another round of medroxyprogesterone. Christen Bame, CMA

## 2018-12-29 MED ORDER — MEDROXYPROGESTERONE ACETATE 10 MG PO TABS: 10.0000 mg | ORAL_TABLET | Freq: Every day | ORAL | 0 refills | Status: DC

## 2018-12-29 NOTE — Addendum Note (Signed)
Addended by: [name] on: 12/29/2018 07:00 PM   Modules accepted: Orders

## 2018-12-29 NOTE — Telephone Encounter (Addendum)
Called and spoke with patient. She has virtual visit with me on this coming Tues. Still bleeding, like a normal period. Will do another round of provera, lower dose 10mg  daily for 10 days. Keep appointment with me on Tues. Will likely reach out to GYN for advice prior to that appointment as her GYN appointment isn't scheduled until the end of November.  [name], MD

## 2019-01-02 ENCOUNTER — Telehealth: Admitting: Family Medicine

## 2019-01-02 ENCOUNTER — Other Ambulatory Visit: Payer: Self-pay

## 2019-01-09 ENCOUNTER — Ambulatory Visit (INDEPENDENT_AMBULATORY_CARE_PROVIDER_SITE_OTHER): Admitting: Family Medicine

## 2019-01-09 ENCOUNTER — Other Ambulatory Visit: Payer: Self-pay

## 2019-01-09 VITALS — BP 118/80 | HR 83 | Wt >= 6400 oz

## 2019-01-09 DIAGNOSIS — Z23 Encounter for immunization: Secondary | ICD-10-CM | POA: Diagnosis not present

## 2019-01-09 DIAGNOSIS — Z91018 Allergy to other foods: Secondary | ICD-10-CM | POA: Diagnosis not present

## 2019-01-09 DIAGNOSIS — N920 Excessive and frequent menstruation with regular cycle: Secondary | ICD-10-CM | POA: Diagnosis not present

## 2019-01-09 MED ORDER — EPINEPHRINE 0.3 MG/0.3ML IJ SOAJ: 0.3000 mg | INTRAMUSCULAR | 1 refills | Status: AC | PRN

## 2019-01-09 MED ORDER — MEDROXYPROGESTERONE ACETATE 10 MG PO TABS: 10.0000 mg | ORAL_TABLET | Freq: Every day | ORAL | 0 refills | Status: DC

## 2019-01-09 NOTE — Assessment & Plan Note (Signed)
Has GYN appointment on 11/25. Spoke with Dr. Nehemiah Settle of OB/GYN who recommends putting her on provera 10mg  daily until she is seen by GYN. Patient agreeable. rx sent.

## 2019-01-09 NOTE — Progress Notes (Signed)
Date of Visit: 01/09/2019   HPI:  Kathy [name] presents for follow up of AUB. She just completed a 10 day course of provera yesterday, and anticipates that she will restart bleeding within a day or two. Denies dizziness, lightheadedness. Feels fine otherwise.  Other issue is that she recently ate pizza with mushrooms on it. She never usually eats mushrooms. After eating the mushroom, her throat began itching on the inside and outside. She took benadryl and it calmed. Is interested in food allergy testing.  ROS: See HPI.  [place]: history of depression, eczema, hypertension, family history of breast and ovarian cancer, morbid obesity  PHYSICAL EXAM: BP 118/80   Pulse 83   Wt (!) 435 lb 12.8 oz (197.7 kg)   SpO2 100%   BMI 68.26 kg/m  Gen: no acute distress, pleasant, cooperative Lungs: Patient speaking normally in full sentences throughout the encounter, without any respiratory distress evident.  Neuro: speech normal, grossly nonfocal  ASSESSMENT/PLAN:  Health maintenance:  -flu shot given today   Polymenorrhea Has GYN appointment on 11/25. Spoke with Dr. Nehemiah Settle of OB/GYN who recommends putting her on provera 10mg  daily until she is seen by GYN. Patient agreeable. rx sent.   Reaction to mushrooms Symptoms concerning for food allergy. Patient desires allergy testing - referral to allergist place. Given rx for epipen and handout on food allergies.  FOLLOW UP: Follow up as needed if symptoms worsen or do not improve.   [place]. Ardelia Mems, [place]

## 2019-01-09 NOTE — Patient Instructions (Addendum)
Sent in a month supply of provera to last until your appointment with gynecology.  Referring to allergist. Sent in epi pen for you.  Flu shot today  Call with any questions or concerns  Be well, Dr. Ardelia Mems    Food Allergy A food allergy is an abnormal reaction to a food (food allergen) by the body's defense system (immune system). Foods that commonly cause allergies include:  Milk.  Seafood.  Eggs.  Peanuts.  Tree nuts such as pecans, walnuts, and cashews.  Wheat.  Soy. What are the causes? Food allergies happen when the immune system sees a food as harmful and releases chemicals (antibodies) to fight it. What are the signs or symptoms? Symptoms may be mild or severe. They usually start minutes after eating the food, but they can occur even a few hours later. In people with a severe allergy, symptoms can start within seconds. Mild symptoms of this condition include:  Congested nose.  Tingling in the mouth.  An itchy, red rash.  Vomiting.  Diarrhea. In people with a severe allergy, a life-threatening reaction can occur called anaphylaxis. Get help right away if you have symptoms of anaphylaxis, such as:  Feeling warm in the face (flushed). This may include redness.  Itchy, red, swollen areas of skin (hives).  Swelling of the eyes, lips, face, mouth, tongue, or throat.  Difficulty breathing, speaking, or swallowing.  Noisy breathing (wheezing).  Dizziness or light-headedness.  Fainting.  Pain or cramping in the abdomen. How is this diagnosed? This condition may be diagnosed based on:  A physical exam.  Your medical history.  Skin tests.  Blood tests.  A food challenge test. This test involves eating the food that may be causing the allergic response while being monitored for a reaction by your health care provider.  The results of an elimination diet. The elimination diet involves removing foods from your diet and then adding them back in, one  at a time.  A food diary. How is this treated? There is no cure for food allergies. Treatment focuses on preventing exposure to the food or foods you are allergic to and treating reactions if you are exposed to the food. Mild symptoms may not need treatment.  Severe reactions usually need to be treated at a hospital. Treatment may include:  Medicines that help: ? Tighten your blood vessels (epinephrine). ? Relieve itching and hives (antihistamines). ? Widen the narrow and tight airways (bronchodilators). ? Reduce swelling (corticosteroids).  Oxygen therapy to help you breathe.  IV fluids to keep you hydrated. After a severe reaction, you may be given rescue medicines, such as:  An anaphylaxis kit.  An epinephrine injection, commonly called an auto-injector "pen" (pre-filled automatic epinephrine injection device). Your health care provider may teach you how to use these if you are accidentally exposed to an allergen. Follow these instructions at home: If you have a potential allergy:  Follow the elimination diet as told by your health care provider.  Keep a food diary as told by your health care provider. Every day, write down: ? What you eat and drink and when. ? What symptoms you have and when. If you have a severe allergy:   Wear a medical alert bracelet or necklace that describes your allergy.  Carry your anaphylaxis kit or an auto-injector pen with you at all times. Use them as told by your health care provider.  Make sure that you, your family members, and your employer know: ? The signs of anaphylaxis. ?  How to use an anaphylaxis kit. ? How to use an auto-injector pen.  If you think that you are having an anaphylactic reaction, use your auto-injector pen or anaphylaxis kit.  Replace your auto-injector pen immediately after use, in case you have another reaction.  Get medical care after use your auto-injector pen. This is important because you can have a delayed,  life-threatening reaction after taking the medicine (rebound anaphylaxis). General instructions  Avoid the foods that you are allergic to.  Read food labels before you eat packaged items. Look for ingredients that you are allergic to.  When you are at a restaurant, tell your server that you have an allergy. If you are unsure whether a meal has an ingredient that you are allergic to, ask your server.  Take over-the-counter and prescription medicines only as told by your health care provider. ? Do not drive until the medicine has worn off, unless your health care provider gives you approval.  Inform all health care providers that you have a food allergy.  If you think that you might be allergic to something else, talk with your health care provider. Do not eat a food to see if you are allergic to it without talking with your health care provider first. Contact a health care provider if you:  Have symptoms that do not go away within 2 days.  Have symptoms that get worse.  Have new symptoms. Get help right away if you have symptoms of anaphylaxis:  Flushed skin.  Hives.  Swelling of the eyes, lips, face, mouth, tongue, or throat.  Difficulty breathing, speaking, or swallowing.  Wheezing.  Dizziness or light-headedness.  Fainting.  Pain or cramping in the abdomen. These symptoms may represent a serious problem that is an emergency. Do not wait to see if the symptoms will go away. Use your auto-injector pen or anaphylaxis kit as you have been told. Get medical help right away. Call your local emergency services (911 in the [place] Do not drive yourself to the hospital. If you needed to use an auto-injector pen, you need more medical care even if the medicine seems to be helping. This is important because anaphylaxis may happen again within 72 hours. Summary  A food allergy is an abnormal reaction to a food (food allergen) by the body's defense system (immune system).  There is  no cure for food allergies. Treatment focuses on preventing exposure to the food or foods you are allergic to and treating reactions if you are exposed to the food.  Wear a medical alert bracelet or necklace that describes your allergy.  If you have symptoms of anaphylaxis, use your auto-injector pen or anaphylaxis kit as you have been instructed, and get medical help right away. This information is not intended to replace advice given to you by your health care provider. Make sure you discuss any questions you have with your health care provider. Document Released: 02/20/2000 Document Revised: 02/22/2017 Document Reviewed: 02/22/2017 Elsevier Patient Education  2020 [employer].

## 2019-01-31 ENCOUNTER — Encounter: Admitting: Family Medicine

## 2019-03-28 ENCOUNTER — Telehealth: Payer: Self-pay

## 2019-03-28 NOTE — Telephone Encounter (Signed)
Patient calls nurse line stating she is drowning in medical bills and unable to follow through with GYN. Patient stated she is still having trouble with her periods, she finished the 10 day course of provera (back in November.) Patient stated she didn't bleed for a while and then over the last few weeks she has started again with cramping. Patient stated she can not afford to come into the office to see a provider. Patient agreed to phone conversation with PCP, however no available apts until 2/9. The patient does not want to wait this long. I have scheduled with Jennette Kettle on 1/27, as patient requested her.

## 2019-04-04 ENCOUNTER — Other Ambulatory Visit: Payer: Self-pay

## 2019-04-04 ENCOUNTER — Telehealth (INDEPENDENT_AMBULATORY_CARE_PROVIDER_SITE_OTHER): Payer: Medicaid Other | Admitting: Family Medicine

## 2019-04-04 DIAGNOSIS — N920 Excessive and frequent menstruation with regular cycle: Secondary | ICD-10-CM | POA: Diagnosis present

## 2019-04-06 NOTE — Progress Notes (Signed)
[facility] Telemedicine Visit  Patient consented to have visit conducted via     video phone call.  Doximity was used Two separate patient identifiers used to verify identity.  Encounter participants: Patient: Kathy [name]  Provider: Denny Levy  Others (if applicable): none  Chief Complaint / HPI:  Vaginal bleeding.  Has been using Provera which is not helping.  She received a large bill for her last few visits and has no insurance.  We had referred her to gynecology but says she cannot afford to go.   OBJECTIVE observed via telephone or video  device: PSYCH: AxOx4.  Appropriate speech fluency and content. Asks and answers questions appropriately. Mood is congruent. RESPIRATORY: no unusual sounds of labored breathing    Pertinent PMHx / PSHx:  I have reviewed the patient's medications, allergies, past medical and surgical history, smoking status and updated in the EMR as appropriate.   Assessment/Plan:  Polymenorrhea She really needs at least a pelvic ultrasound.  I will check with the office and see if they have received her information for application to the indigent phone.  I will get back with her next week.  Discussed at length options for treatment.

## 2019-04-06 NOTE — Assessment & Plan Note (Signed)
She really needs at least a pelvic ultrasound.  I will check with the office and see if they have received her information for application to the indigent phone.  I will get back with her next week.  Discussed at length options for treatment.

## 2019-07-20 ENCOUNTER — Telehealth: Payer: Self-pay

## 2019-07-20 NOTE — Telephone Encounter (Signed)
Patient calls nurse line stating she needs an apt with PCP to discuss AUB and weight loss. However, patient reports that she can not afford to come in and is requesting a "telephone" only apt. I advised patient we are moving away from this, however I will see if PCP minds, I went ahead and scheduled a virtual for 6/1. Please let me know if this needs to be changed.   Of note, patient states she has filled out financial assistance paperwork, however has not heard back from anyone. Will forward to [place] as well.

## 2019-08-07 ENCOUNTER — Other Ambulatory Visit: Payer: Self-pay

## 2019-08-07 ENCOUNTER — Telehealth (INDEPENDENT_AMBULATORY_CARE_PROVIDER_SITE_OTHER): Payer: Self-pay | Admitting: Family Medicine

## 2019-08-07 DIAGNOSIS — Z5329 Procedure and treatment not carried out because of patient's decision for other reasons: Secondary | ICD-10-CM

## 2019-08-07 DIAGNOSIS — Z91199 Patient's noncompliance with other medical treatment and regimen due to unspecified reason: Secondary | ICD-10-CM

## 2019-08-07 NOTE — Progress Notes (Signed)
Sent patient a text through video software inviting her to join video call, with no response. Then called patient on the phone x2, was not able to reach her. Voicemail full and I cannot leave a message. She may reschedule at another time if she desires.  Latrelle Dodrill, MD

## 2020-03-15 ENCOUNTER — Ambulatory Visit: Payer: Medicaid Other

## 2020-08-01 ENCOUNTER — Other Ambulatory Visit: Payer: Self-pay

## 2020-08-01 ENCOUNTER — Ambulatory Visit (INDEPENDENT_AMBULATORY_CARE_PROVIDER_SITE_OTHER): Payer: Self-pay

## 2020-08-01 ENCOUNTER — Encounter (HOSPITAL_COMMUNITY): Payer: Self-pay

## 2020-08-01 ENCOUNTER — Ambulatory Visit (HOSPITAL_COMMUNITY)
Admission: EM | Admit: 2020-08-01 | Discharge: 2020-08-01 | Disposition: A | Discharge status: Home / Self Care | Payer: Self-pay | Attending: Emergency Medicine | Admitting: Emergency Medicine

## 2020-08-01 DIAGNOSIS — W19XXXA Unspecified fall, initial encounter: Secondary | ICD-10-CM

## 2020-08-01 DIAGNOSIS — M25552 Pain in left hip: Secondary | ICD-10-CM

## 2020-08-01 DIAGNOSIS — M25551 Pain in right hip: Secondary | ICD-10-CM

## 2020-08-01 DIAGNOSIS — M25559 Pain in unspecified hip: Secondary | ICD-10-CM

## 2020-08-01 MED ORDER — IBUPROFEN 800 MG PO TABS: 800.0000 mg | ORAL_TABLET | Freq: Three times a day (TID) | ORAL | 0 refills | Status: AC

## 2020-08-01 NOTE — ED Triage Notes (Signed)
Pt in with c/o right hip pain that started this morning after she fell while putting on her clothes  Pt has not had medication for pain

## 2020-08-01 NOTE — ED Provider Notes (Signed)
[facility]    CSN: [id number] Arrival date & time: 08/01/20  9622      History   Chief Complaint Chief Complaint  Patient presents with  . Fall  . Hip Pain    HPI Kathy [name] is a 25 y.o. female.   Kathy [name] presents with complaints of right hip pain. Today while pulling up her jumper she had pain to the right hip, from maybe a twist of it (?), which then caused her to lose her balance and fall onto her side table, striking her right hip on it as she fell. Pain since. Particularly with certain movements while walking. Pain has improved some since happening. No previous injury to the hip. No numbness or tingling. Hasn't taken any medications for pain.     ROS per HPI, negative if not otherwise mentioned.      Past Medical History:  Diagnosis Date  . Obesity   . Strep throat     Patient Active Problem List   Diagnosis Date Noted  . Polymenorrhea 12/02/2018  . Gastroenteritis 06/09/2016  . Family history of breast cancer 03/31/2016  . Menstrual cramps 03/31/2016  . Binge eating 07/23/2015  . Depression 07/23/2015  . Eczema 06/22/2012  . ELEVATED BLOOD PRESSURE 04/06/2007  . Morbid obesity (HCC) 12/28/2006    Past Surgical History:  Procedure Laterality Date  . TONSILLECTOMY  2007    OB History   No obstetric history on file.      Home Medications    Prior to Admission medications   Medication Sig Start Date End Date Taking? Authorizing Provider  ibuprofen (ADVIL) 800 MG tablet Take 1 tablet (800 mg total) by mouth 3 (three) times daily. 08/01/20  Yes Mahathi Pokorney, Barron Alvine, NP  albuterol (PROVENTIL HFA;VENTOLIN HFA) 108 (90 BASE) MCG/ACT inhaler Inhale 4 puffs into the lungs every 4 (four) hours as needed for wheezing or shortness of breath. 04/02/14   Marcellina Millin, MD  EPINEPHrine 0.3 mg/0.3 mL IJ SOAJ injection Inject 0.3 mLs (0.3 mg total) into the muscle as needed for anaphylaxis. 01/09/19   Latrelle Dodrill, MD   medroxyPROGESTERone (PROVERA) 10 MG tablet Take 1 tablet (10 mg total) by mouth daily. 01/09/19   Latrelle Dodrill, MD    Family History Family History  Problem Relation Age of Onset  . Breast cancer Mother 49  . Diabetes Father   . Diabetes Paternal Grandmother   . Diabetes Paternal Grandfather   . Cervical cancer Maternal Grandmother   . Cervical cancer Maternal Aunt     Social History Social History   Tobacco Use  . Smoking status: Never Smoker  . Smokeless tobacco: Never Used  Substance Use Topics  . Alcohol use: No  . Drug use: No     Allergies   Mushroom extract complex   Review of Systems Review of Systems   Physical Exam Triage Vital Signs ED Triage Vitals  Enc Vitals Group     BP 08/01/20 1011 (!) 136/95     Pulse Rate 08/01/20 1011 93     Resp 08/01/20 1011 20     Temp 08/01/20 1011 98.4 F (36.9 C)     Temp Source 08/01/20 1011 Oral     SpO2 08/01/20 1011 95 %     Weight --      Height --      Head Circumference --      Peak Flow --      Pain Score 08/01/20 1010 7  Pain Loc --      Pain Edu? --      Excl. in GC? --    No data found.  Updated Vital Signs BP (!) 136/95 (BP Location: Right Arm)   Pulse 93   Temp 98.4 F (36.9 C) (Oral)   Resp 20   LMP 07/29/2020 (Exact Date)   SpO2 95%   Visual Acuity Right Eye Distance:   Left Eye Distance:   Bilateral Distance:    Right Eye Near:   Left Eye Near:    Bilateral Near:     Physical Exam Constitutional:      General: She is not in acute distress.    Appearance: She is well-developed. She is obese.  Cardiovascular:     Rate and Rhythm: Normal rate.  Pulmonary:     Effort: Pulmonary effort is normal.  Musculoskeletal:     Right hip: Tenderness present. No deformity. Normal range of motion.     Comments: Body habitus does limit exam, but with apparent point tenderness on palpation with with weight bearing to right proximal femur; no pain with passive ROM to right hip;  ambulatory without apparent difficulty; gross sensation intact   Skin:    General: Skin is warm and dry.  Neurological:     Mental Status: She is alert and oriented to person, place, and time.      UC Treatments / Results  Labs (all labs ordered are listed, but only abnormal results are displayed) Labs Reviewed - No data to display  EKG   Radiology DG Hip Unilat With Pelvis 2-3 Views Right  Result Date: 08/01/2020 CLINICAL DATA:  RIGHT hip pain since this morning, fell while putting on her clothes EXAM: DG HIP (WITH OR WITHOUT PELVIS) 2-3V RIGHT COMPARISON:  None FINDINGS: Osseous mineralization normal. Hip and SI joint spaces preserved. No acute fracture, dislocation, or bone destruction. IMPRESSION: Normal exam. Electronically Signed   By: Ulyses Southward M.D.   On: 08/01/2020 11:24    Procedures Procedures (including critical care time)  Medications Ordered in UC Medications - No data to display  Initial Impression / Assessment and Plan / UC Course  I have reviewed the triage vital signs and the nursing notes.  Pertinent labs & imaging results that were available during my care of the patient were reviewed by me and considered in my medical decision making (see chart for details).     Patient does request imaging of the hip today due to the severity of pain she experienced earlier today, although pain is improving. No red flag findings on exam. Ambulatory. Xray without acute findings. Sprain and contusion likely source of symptoms with nsaids and activity alteration discussed. Return precautions provided. Patient verbalized understanding and agreeable to plan.  Ambulatory out of clinic without difficulty.    Final Clinical Impressions(s) / UC Diagnoses   Final diagnoses:  Left hip pain  Fall, initial encounter  Hip pain     Discharge Instructions     You can apply ice today to help with pain. After today may want to use heat to help with any bruising.  Activity as  tolerated, light and regular activity.  Ibuprofen as needed for pain.  Return or follow up with sports medicine or your PCP if pain doesn't improve in the next 2 weeks.     ED Prescriptions    Medication Sig Dispense Auth. Provider   ibuprofen (ADVIL) 800 MG tablet Take 1 tablet (800 mg total) by mouth 3 (three)  times daily. 30 tablet Georgetta Haber, NP     PDMP not reviewed this encounter.   Georgetta Haber, NP 08/01/20 1143

## 2020-08-01 NOTE — Discharge Instructions (Signed)
You can apply ice today to help with pain. After today may want to use heat to help with any bruising.  Activity as tolerated, light and regular activity.  Ibuprofen as needed for pain.  Return or follow up with sports medicine or your PCP if pain doesn't improve in the next 2 weeks.

## 2020-11-26 ENCOUNTER — Ambulatory Visit (HOSPITAL_COMMUNITY)
Admission: EM | Admit: 2020-11-26 | Discharge: 2020-11-26 | Disposition: A | Discharge status: Home / Self Care | Payer: Self-pay | Attending: Family Medicine | Admitting: Family Medicine

## 2020-11-26 ENCOUNTER — Other Ambulatory Visit: Payer: Self-pay

## 2020-11-26 ENCOUNTER — Encounter (HOSPITAL_COMMUNITY): Payer: Self-pay

## 2020-11-26 DIAGNOSIS — R1084 Generalized abdominal pain: Secondary | ICD-10-CM

## 2020-11-26 DIAGNOSIS — R112 Nausea with vomiting, unspecified: Secondary | ICD-10-CM

## 2020-11-26 DIAGNOSIS — R197 Diarrhea, unspecified: Secondary | ICD-10-CM | POA: Insufficient documentation

## 2020-11-26 LAB — POCT URINALYSIS DIPSTICK, ED / UC
Glucose, UA: NEGATIVE mg/dL
Leukocytes,Ua: NEGATIVE
Nitrite: POSITIVE — AB
Protein, ur: 30 mg/dL — AB
Specific Gravity, Urine: 1.025 (ref 1.005–1.030)
Urobilinogen, UA: 0.2 mg/dL (ref 0.0–1.0)
pH: 6 (ref 5.0–8.0)

## 2020-11-26 LAB — POC URINE PREG, ED: Preg Test, Ur: NEGATIVE

## 2020-11-26 LAB — CBG MONITORING, ED: Glucose-Capillary: 105 mg/dL — ABNORMAL HIGH (ref 70–99)

## 2020-11-26 MED ORDER — SULFAMETHOXAZOLE-TRIMETHOPRIM 800-160 MG PO TABS: 1.0000 | ORAL_TABLET | Freq: Two times a day (BID) | ORAL | 0 refills | Status: AC

## 2020-11-26 MED ORDER — ONDANSETRON 4 MG PO TBDP: 4.0000 mg | ORAL_TABLET | Freq: Three times a day (TID) | ORAL | 0 refills | Status: DC | PRN

## 2020-11-26 NOTE — ED Triage Notes (Signed)
Pt presents with generalized abdominal pain with nausea and vomiting X 2 days.

## 2020-11-26 NOTE — ED Provider Notes (Signed)
[facility]    CSN: [id number] Arrival date & time: 11/26/20  1115      History   Chief Complaint Chief Complaint  Patient presents with   Abdominal Pain    HPI Kathy [name] is a 25 y.o. female.   HPI  Abdominal Pain: Pt reports that for the past 2 days she has had abdominal pain, nausea, vomiting and diarrhea. No bloody emesis or bowel movements. No fever or abdominal pain. She has tried tums, pepto with minimal improvement. She has been trying to stay hydrated. No recent ABX use or hospitalizations.   Past Medical History:  Diagnosis Date   Obesity    Strep throat     Patient Active Problem List   Diagnosis Date Noted   Polymenorrhea 12/02/2018   Gastroenteritis 06/09/2016   Family history of breast cancer 03/31/2016   Menstrual cramps 03/31/2016   Binge eating 07/23/2015   Depression 07/23/2015   Eczema 06/22/2012   ELEVATED BLOOD PRESSURE 04/06/2007   Morbid obesity (HCC) 12/28/2006    Past Surgical History:  Procedure Laterality Date   TONSILLECTOMY  2007    OB History   No obstetric history on file.      Home Medications    Prior to Admission medications   Medication Sig Start Date End Date Taking? Authorizing Provider  albuterol (PROVENTIL HFA;VENTOLIN HFA) 108 (90 BASE) MCG/ACT inhaler Inhale 4 puffs into the lungs every 4 (four) hours as needed for wheezing or shortness of breath. 04/02/14   Marcellina Millin, MD  EPINEPHrine 0.3 mg/0.3 mL IJ SOAJ injection Inject 0.3 mLs (0.3 mg total) into the muscle as needed for anaphylaxis. 01/09/19   Latrelle Dodrill, MD  ibuprofen (ADVIL) 800 MG tablet Take 1 tablet (800 mg total) by mouth 3 (three) times daily. 08/01/20   Georgetta Haber, NP  medroxyPROGESTERone (PROVERA) 10 MG tablet Take 1 tablet (10 mg total) by mouth daily. 01/09/19   Latrelle Dodrill, MD    Family History Family History  Problem Relation Age of Onset   Breast cancer Mother 27   Diabetes Father    Diabetes  Paternal Grandmother    Diabetes Paternal Grandfather    Cervical cancer Maternal Grandmother    Cervical cancer Maternal Aunt     Social History Social History   Tobacco Use   Smoking status: Never   Smokeless tobacco: Never  Substance Use Topics   Alcohol use: No   Drug use: No     Allergies   Mushroom extract complex   Review of Systems Review of Systems  As stated above in HPI Physical Exam Triage Vital Signs ED Triage Vitals  Enc Vitals Group     BP 11/26/20 1223 138/89     Pulse Rate 11/26/20 1223 (!) 105     Resp 11/26/20 1223 18     Temp 11/26/20 1223 98.1 F (36.7 C)     Temp Source 11/26/20 1223 Oral     SpO2 11/26/20 1223 98 %     Weight --      Height --      Head Circumference --      Peak Flow --      Pain Score 11/26/20 1222 6     Pain Loc --      Pain Edu? --      Excl. in GC? --    No data found.  Updated Vital Signs BP 138/89 (BP Location: Left Arm)   Pulse (!) 105  Temp 98.1 F (36.7 C) (Oral)   Resp 18   LMP 11/08/2020 Comment: PCOS  SpO2 98%   Physical Exam Vitals and nursing note reviewed.  Constitutional:      General: She is not in acute distress.    Appearance: She is well-developed. She is obese. She is not ill-appearing, toxic-appearing or diaphoretic.  HENT:     Mouth/Throat:     Comments: Semi dry Eyes:     General: No scleral icterus.    Extraocular Movements: Extraocular movements intact.     Pupils: Pupils are equal, round, and reactive to light.  Cardiovascular:     Rate and Rhythm: Regular rhythm. Tachycardia present.     Heart sounds: Normal heart sounds.  Pulmonary:     Effort: Pulmonary effort is normal.     Breath sounds: Normal breath sounds.  Abdominal:     General: Abdomen is flat. Bowel sounds are normal. There is no distension or abdominal bruit.     Palpations: Abdomen is soft. There is no shifting dullness, fluid wave, hepatomegaly or splenomegaly.     Tenderness: There is no abdominal  tenderness. There is no right CVA tenderness, left CVA tenderness, guarding or rebound. Negative signs include Murphy's sign and McBurney's sign.     Hernia: No hernia is present.  Skin:    General: Skin is warm.  Neurological:     Mental Status: She is alert and oriented to person, place, and time.     UC Treatments / Results  Labs (all labs ordered are listed, but only abnormal results are displayed) Labs Reviewed - No data to display  EKG   Radiology No results found.  Procedures Procedures (including critical care time)  Medications Ordered in UC Medications - No data to display  Initial Impression / Assessment and Plan / UC Course  I have reviewed the triage vital signs and the nursing notes.  Pertinent labs & imaging results that were available during my care of the patient were reviewed by me and considered in my medical decision making (see chart for details).     New. CBG is normal UA suggestive of acute cystitis with hematuria so we will start on batcrim and zofran for nausea. Hydration with water and electrolyte drink. Close ER return precautions.  Final Clinical Impressions(s) / UC Diagnoses   Final diagnoses:  None   Discharge Instructions   None    ED Prescriptions   None    PDMP not reviewed this encounter.   Rushie Chestnut, [place] 11/26/20 1347

## 2020-11-28 LAB — URINE CULTURE: Culture: >=100000 — AB

## 2021-03-25 ENCOUNTER — Other Ambulatory Visit: Payer: Self-pay

## 2021-03-25 ENCOUNTER — Telehealth: Payer: Self-pay

## 2021-03-25 ENCOUNTER — Ambulatory Visit (INDEPENDENT_AMBULATORY_CARE_PROVIDER_SITE_OTHER): Payer: 59 | Admitting: Family Medicine

## 2021-03-25 ENCOUNTER — Encounter: Payer: Self-pay | Admitting: Family Medicine

## 2021-03-25 VITALS — BP 130/107 | HR 91 | Ht 67.0 in | Wt >= 6400 oz

## 2021-03-25 DIAGNOSIS — R030 Elevated blood-pressure reading, without diagnosis of hypertension: Secondary | ICD-10-CM

## 2021-03-25 DIAGNOSIS — F32A Depression, unspecified: Secondary | ICD-10-CM

## 2021-03-25 NOTE — Telephone Encounter (Signed)
Patient calls nurse line regarding metformin prescription. Patient states that she went to pharmacy to pick up today, however, they did not have rx.   Please send prescription to [facility] on [address].   Veronda Prude, RN

## 2021-03-25 NOTE — Patient Instructions (Signed)
I will send in some metformin. Start one a day. Call me if you have any problems with it.  I will send you a note about your labs.  Keep a diary of two things:  Daily minutes of exercise and daily grams of protein intake  Let me see you in about 4 or 5 weeks.

## 2021-03-26 LAB — COMPREHENSIVE METABOLIC PANEL
ALT: 27 IU/L (ref 0–32)
AST: 19 IU/L (ref 0–40)
Albumin/Globulin Ratio: 1.6 (ref 1.2–2.2)
Albumin: 4.2 g/dL (ref 3.9–5.0)
Alkaline Phosphatase: 88 IU/L (ref 44–121)
BUN/Creatinine Ratio: 19 (ref 9–23)
BUN: 15 mg/dL (ref 6–20)
Bilirubin Total: 0.4 mg/dL (ref 0.0–1.2)
CO2: 21 mmol/L (ref 20–29)
Calcium: 9.2 mg/dL (ref 8.7–10.2)
Chloride: 103 mmol/L (ref 96–106)
Creatinine, Ser: 0.78 mg/dL (ref 0.57–1.00)
Globulin, Total: 2.6 g/dL (ref 1.5–4.5)
Glucose: 92 mg/dL (ref 70–99)
Potassium: 4.6 mmol/L (ref 3.5–5.2)
Sodium: 139 mmol/L (ref 134–144)
Total Protein: 6.8 g/dL (ref 6.0–8.5)
eGFR: 109 mL/min/{1.73_m2} (ref >59)

## 2021-03-26 LAB — CBC
Hematocrit: 37.5 % (ref 34.0–46.6)
Hemoglobin: 12.2 g/dL (ref 11.1–15.9)
MCH: 25.9 pg — ABNORMAL LOW (ref 26.6–33.0)
MCHC: 32.5 g/dL (ref 31.5–35.7)
MCV: 80 fL (ref 79–97)
Platelets: 346 10*3/uL (ref 150–450)
RBC: 4.71 x10E6/uL (ref 3.77–5.28)
RDW: 13.9 % (ref 11.7–15.4)
WBC: 7.2 10*3/uL (ref 3.4–10.8)

## 2021-03-26 LAB — LDL CHOLESTEROL, DIRECT: LDL Direct: 98 mg/dL (ref 0–99)

## 2021-03-26 LAB — TSH: TSH: 0.936 u[IU]/mL (ref 0.450–4.500)

## 2021-03-26 NOTE — Assessment & Plan Note (Signed)
Labs today.  Follow-up.  Low threshold to start low-dose antihypertensive.  Discussed and told her it may not be long-term but would be better if she truly is at these elevated levels.  Follow-up at next visit.

## 2021-03-26 NOTE — Assessment & Plan Note (Signed)
Labs. 3 week diary for protein intake and exercise. Discussed mediation a she has a friend who has had success with ozempic. Will also get A1C.

## 2021-03-26 NOTE — Progress Notes (Signed)
° ° °  CHIEF COMPLAINT / HPI: #1.  New patient my practice.  Several issues.  Most pressing for her is obesity.  She is trying to exercise daily and following a better diet but is not losing any weight.  She is quite frustrated. 2.  History elevated blood pressure: She is concerned that her continued obesity may end up causing her to need blood pressure medicine. 3.  History of depression: Contributing to obesity.  History of binge eating.  Currently doing pretty well with her diet. Before.  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   PERTINENT  PMH / PSH: I have reviewed the patients medications, allergies, past medical and surgical history, smoking status and updated in the EMR as appropriate.   OBJECTIVE:  BP (!) 130/107    Pulse 91    Ht 5\' 7"  (1.702 m)    Wt (!) 483 lb 3.2 oz (219.2 kg)    LMP 03/05/2021 (Approximate)    SpO2 100%    BMI 75.68 kg/m  GENERAL: Well-developed female no acute distress BMI75.6 PSYCH: AxOx4. Good eye contact.. No psychomotor retardation or agitation. Appropriate speech fluency and content. Asks and answers questions appropriately. Mood is congruent.   ASSESSMENT / PLAN:   ELEVATED BLOOD PRESSURE Labs today.  Follow-up.  Low threshold to start low-dose antihypertensive.  Discussed and told her it may not be long-term but would be better if she truly is at these elevated levels.  Follow-up at next visit.  Depression Currently stable. Will follow  Morbid obesity (HCC) Labs. 3 week diary for protein intake and exercise. Discussed mediation a she has a friend who has had success with ozempic. Will also get A1C. F/u 3 wk   03/07/2021 MD

## 2021-03-26 NOTE — Telephone Encounter (Signed)
Patient returns call to nurse line to check status of getting prescription for metformin.   Please advise.   Veronda Prude, RN

## 2021-03-26 NOTE — Assessment & Plan Note (Signed)
Currently stable. Will follow

## 2021-03-27 MED ORDER — METFORMIN HCL ER 500 MG PO TB24: 500.0000 mg | ORAL_TABLET | Freq: Every day | ORAL | 3 refills | Status: DC

## 2021-03-27 NOTE — Telephone Encounter (Signed)
Dear Dema Severin Team My bad! It is sent no. THANKS! Dorcas Mcmurray

## 2021-05-15 ENCOUNTER — Other Ambulatory Visit: Payer: Self-pay | Admitting: Family Medicine

## 2021-06-01 NOTE — Progress Notes (Deleted)
?  Date of Visit: 06/02/2021  ? ?SUBJECTIVE:  ? ?HPI: ? ?Kathy "Cassie" presents today for *** ? ? ?OBJECTIVE:  ? ?There were no vitals taken for this visit. ?Gen: *** ?HEENT: *** ?Heart: *** ?Lungs: *** ?Neuro: *** ?Ext: *** ? ?ASSESSMENT/PLAN:  ? ?Health maintenance:  ?-*** ? ?No problem-specific Assessment & Plan notes found for this encounter. ? ?FOLLOW UP: ?Follow up in *** for *** ? ?[place] J. Pollie Meyer, MD ?[facility] ?

## 2021-06-02 ENCOUNTER — Ambulatory Visit: Payer: 59 | Admitting: Family Medicine

## 2021-06-16 ENCOUNTER — Ambulatory Visit: Payer: 59 | Admitting: Family Medicine

## 2021-07-14 ENCOUNTER — Ambulatory Visit (INDEPENDENT_AMBULATORY_CARE_PROVIDER_SITE_OTHER): Payer: 59 | Admitting: Family Medicine

## 2021-07-14 ENCOUNTER — Encounter: Payer: Self-pay | Admitting: Family Medicine

## 2021-07-14 VITALS — BP 146/86 | HR 102 | Ht 67.0 in | Wt >= 6400 oz

## 2021-07-14 DIAGNOSIS — I10 Essential (primary) hypertension: Secondary | ICD-10-CM

## 2021-07-14 DIAGNOSIS — N920 Excessive and frequent menstruation with regular cycle: Secondary | ICD-10-CM | POA: Diagnosis not present

## 2021-07-14 MED ORDER — HYDROCHLOROTHIAZIDE 12.5 MG PO TABS: 12.5000 mg | ORAL_TABLET | Freq: Every day | ORAL | 0 refills | Status: DC

## 2021-07-14 MED ORDER — ALBUTEROL SULFATE HFA 108 (90 BASE) MCG/ACT IN AERS: 4.0000 | INHALATION_SPRAY | RESPIRATORY_TRACT | 0 refills | Status: DC | PRN

## 2021-07-14 MED ORDER — MEDROXYPROGESTERONE ACETATE 10 MG PO TABS: 10.0000 mg | ORAL_TABLET | Freq: Every day | ORAL | 0 refills | Status: DC

## 2021-07-14 NOTE — Patient Instructions (Addendum)
It was great to see you again today! ? ?Sent in provera for you - 10 day course ?Referring to gynecologist ? ?Call the [facility] to set up an appointment for weight management. Their number is [phone].  ? ?Refilled albuterol ? ?Tdap vaccine today ? ?Keep up the great work! ?See me in 3 months, sooner if needed ? ?Be well, ?Dr. Pollie Meyer ?

## 2021-07-20 DIAGNOSIS — I10 Essential (primary) hypertension: Secondary | ICD-10-CM | POA: Insufficient documentation

## 2021-07-20 NOTE — Assessment & Plan Note (Signed)
Likely anovulatory bleeding. Needs to see GYN for full assessment, previously referred but was unable to attend appts. I have entered a new referral for her to be seen. Anticipate need for pelvic u/s but will defer decision to GYN. Treat with provera 10mg  daily for 10 days to provide break in bleeding. ?

## 2021-07-20 NOTE — Assessment & Plan Note (Signed)
Congratulated patient's motivation and efforts at losing weight. ?I think she would be a great candidate for the [facility] here at [facility]. ?I've given her their information so she can call to schedule with them. ?

## 2021-07-20 NOTE — Progress Notes (Signed)
?  Date of Visit: 07/14/2021  ? ?SUBJECTIVE:  ? ?HPI: ? ?Shanterria "Cassie" presents today for follow up of weight and periods. ? ?Weight - saw Dr. Jennette Kettle in January, was prescribed metformin. Has stopped taking this as it made her nauseated. She is working hard to eat healthier and is moving more. Has begun doing Zumba at the [facility] and has really enjoyed that. Cassie is motivated to lose weight. She prefers to lose weight via diet and exercise rather than medications. ? ?Polymenorrhea - reports bleeding daily for about 2 months. Has had to wear about 4 pads per day. Thinks it could be stress related, as her periods were more normal when she went through a period of decreased stress. Has not seen GYN but plans to. She requests rx for provera for 10 days just to get some relief from the bleeding for a little bit. Has never been sexually active. Open to idea of pap smear but not today as she needs to return to work promptly. ? ?Breathing - uses albuterol inhaler as needed. May have had prior dx of asthma, unclear. Only uses 1-2 times a month. Would like new inhaler just to have on hand if needed. ? ?OBJECTIVE:  ? ?BP (!) 146/86   Pulse (!) 102   Ht 5\' 7"  (1.702 m)   Wt (!) 486 lb 12.8 oz (220.8 kg)   BMI 76.24 kg/m?  ?Gen: no acute distress, pleasant, cooperative ?HEENT: normocephalic, atraumatic  ?Lungs: normal work of breathing  ?Neuro: alert, speech normal, grossly nonfocal ?Psych: normal range of affect, well groomed, speech normal in rate and volume, normal eye contact  ? ?ASSESSMENT/PLAN:  ? ?Health maintenance:  ?-offered Tdap but will wait as she is unclear of insurance coverage at this time ? ?Morbid obesity (HCC) ?Congratulated patient's motivation and efforts at losing weight. ?I think she would be a great candidate for the [facility] here at [facility]. ?I've given her their information so she can call to schedule with them. ? ?Polymenorrhea ?Likely anovulatory bleeding. Needs to see GYN for  full assessment, previously referred but was unable to attend appts. I have entered a new referral for her to be seen. Anticipate need for pelvic u/s but will defer decision to GYN. Treat with provera 10mg  daily for 10 days to provide break in bleeding. ? ?Hypertension ?Blood pressure elevated today including on repeat, has been elevated in past too. ?Start HCTZ 12.5mg  daily ?Follow up in several weeks to check blood pressure and BMET. ? ?Breathing ?Using albuterol occasionally, refill so she has on hand ? ?FOLLOW UP: ?Follow up in several weeks for hypertension  ?Referring to GYN ?Schedule with [facility]  ? ?[facility]. , MD ?[facility] ?

## 2021-07-20 NOTE — Assessment & Plan Note (Signed)
Blood pressure elevated today including on repeat, has been elevated in past too. ?Start HCTZ 12.5mg  daily ?Follow up in several weeks to check blood pressure and BMET. ?

## 2021-07-21 ENCOUNTER — Other Ambulatory Visit: Payer: Self-pay | Admitting: Family Medicine

## 2021-07-22 ENCOUNTER — Other Ambulatory Visit: Payer: Self-pay | Admitting: Family Medicine

## 2021-07-30 ENCOUNTER — Encounter: Payer: Self-pay | Admitting: Family Medicine

## 2021-08-10 ENCOUNTER — Ambulatory Visit: Payer: 59 | Admitting: Family Medicine

## 2021-08-11 ENCOUNTER — Encounter: Payer: Self-pay | Admitting: *Deleted

## 2021-08-31 ENCOUNTER — Ambulatory Visit (HOSPITAL_COMMUNITY)
Admission: EM | Admit: 2021-08-31 | Discharge: 2021-08-31 | Disposition: A | Discharge status: Home / Self Care | Payer: 59

## 2021-08-31 ENCOUNTER — Encounter (HOSPITAL_COMMUNITY): Payer: Self-pay | Admitting: Emergency Medicine

## 2021-08-31 DIAGNOSIS — J069 Acute upper respiratory infection, unspecified: Secondary | ICD-10-CM | POA: Diagnosis not present

## 2021-08-31 NOTE — ED Provider Notes (Signed)
[facility]    CSN: [id number] Arrival date & time: 08/31/21  1426     History   Chief Complaint Chief Complaint  Patient presents with   Cough    HPI Kathy [name] is a 26 y.o. female.  Presents with 1 day history of cough that began last night.  It is nonproductive and occasional.  Denies shortness of breath or chest pain.  Sore throat began this morning, feels like irritation when she swallows.  History of T&A.  Denies any fever at home.  Has not tried any medicine for her symptoms.  She did take 4 at home COVID test that were all negative.  History of high blood pressure for which she sees her primary care. Lifestyle changes such as weight loss for treatment. Denies headache, vision changes, blurry vision, dizziness, chest pain, abdominal pain, vomiting/diarrhea, rash, weakness, numbness or tingling.  Past Medical History:  Diagnosis Date   Obesity    Strep throat     Patient Active Problem List   Diagnosis Date Noted   Hypertension 07/20/2021   Polymenorrhea 12/02/2018   Family history of breast cancer 03/31/2016   Menstrual cramps 03/31/2016   Binge eating 07/23/2015   Depression 07/23/2015   Eczema 06/22/2012   ELEVATED BLOOD PRESSURE 04/06/2007   Morbid obesity (HCC) 12/28/2006    Past Surgical History:  Procedure Laterality Date   TONSILLECTOMY  2007    OB History   No obstetric history on file.      Home Medications    Prior to Admission medications   Medication Sig Start Date End Date Taking? Authorizing Provider  albuterol (VENTOLIN HFA) 108 (90 Base) MCG/ACT inhaler Inhale 4 puffs into the lungs every 4 (four) hours as needed for wheezing or shortness of breath. 07/14/21   Latrelle Dodrill, MD  EPINEPHrine 0.3 mg/0.3 mL IJ SOAJ injection Inject 0.3 mLs (0.3 mg total) into the muscle as needed for anaphylaxis. 01/09/19   Latrelle Dodrill, MD  hydrochlorothiazide (HYDRODIURIL) 12.5 MG tablet Take 1 tablet (12.5 mg total) by mouth  daily. 07/14/21   Latrelle Dodrill, MD  ibuprofen (ADVIL) 800 MG tablet Take 1 tablet (800 mg total) by mouth 3 (three) times daily. 08/01/20   Georgetta Haber, NP  medroxyPROGESTERone (PROVERA) 10 MG tablet Take 1 tablet (10 mg total) by mouth daily. 07/14/21   Latrelle Dodrill, MD    Family History Family History  Problem Relation Age of Onset   Breast cancer Mother 65   Diabetes Father    Diabetes Paternal Grandmother    Diabetes Paternal Grandfather    Cervical cancer Maternal Grandmother    Cervical cancer Maternal Aunt     Social History Social History   Tobacco Use   Smoking status: Never   Smokeless tobacco: Never  Vaping Use   Vaping Use: Never used  Substance Use Topics   Alcohol use: No   Drug use: No     Allergies   Mushroom extract complex   Review of Systems Review of Systems  Respiratory:  Positive for cough.    Per HPI  Physical Exam Triage Vital Signs ED Triage Vitals  Enc Vitals Group     BP 08/31/21 1506 (!) 162/112     Pulse Rate 08/31/21 1506 (!) 103     Resp 08/31/21 1506 18     Temp 08/31/21 1506 97.9 F (36.6 C)     Temp Source 08/31/21 1506 Oral     SpO2 08/31/21  1506 97 %     Weight 08/31/21 1508 (!) 506 lb (229.5 kg)     Height 08/31/21 1508 5\' 7"  (1.702 m)     Head Circumference --      Peak Flow --      Pain Score 08/31/21 1508 7     Pain Loc --      Pain Edu? --      Excl. in GC? --    No data found.  Updated Vital Signs BP (!) 162/112 (BP Location: Left Arm)   Pulse (!) 103   Temp 97.9 F (36.6 C) (Oral)   Resp 18   Ht 5\' 7"  (1.702 m)   Wt (!) 506 lb (229.5 kg)   SpO2 97%   BMI 79.25 kg/m    Physical Exam Vitals and nursing note reviewed.  Constitutional:      General: She is not in acute distress.    Appearance: She is obese.  HENT:     Right Ear: Tympanic membrane and ear canal normal.     Left Ear: Tympanic membrane and ear canal normal.     Nose: No congestion.     Mouth/Throat:     Mouth:  Mucous membranes are moist.     Pharynx: Uvula midline. No posterior oropharyngeal erythema.     Tonsils: No tonsillar exudate or tonsillar abscesses.  Eyes:     Conjunctiva/sclera: Conjunctivae normal.     Pupils: Pupils are equal, round, and reactive to light.  Cardiovascular:     Rate and Rhythm: Normal rate and regular rhythm.     Heart sounds: Normal heart sounds.  Pulmonary:     Effort: Pulmonary effort is normal. No respiratory distress.     Breath sounds: Normal breath sounds. No wheezing.  Abdominal:     General: Bowel sounds are normal.     Palpations: Abdomen is soft.     Tenderness: There is no abdominal tenderness.  Musculoskeletal:     Cervical back: Normal range of motion.  Lymphadenopathy:     Cervical: No cervical adenopathy.  Neurological:     Mental Status: She is alert and oriented to person, place, and time.     UC Treatments / Results  Labs (all labs ordered are listed, but only abnormal results are displayed) Labs Reviewed - No data to display  EKG  Radiology No results found.  Procedures Procedures   Medications Ordered in UC Medications - No data to display  Initial Impression / Assessment and Plan / UC Course  I have reviewed the triage vital signs and the nursing notes.  Pertinent labs & imaging results that were available during my care of the patient were reviewed by me and considered in my medical decision making (see chart for details).  Elevated BP however asymptomatic. Exam is unremarkable.  She is very well-appearing.  May have a virus causing cough and sore throat.  Declines strep and PCR COVID today. Symptomatic care at home. Follow-up with primary care.  Emergency department if symptoms worsen. Return precautions discussed. Patient agrees to plan and is discharged in stable condition.  Final Clinical Impressions(s) / UC Diagnoses   Final diagnoses:  Viral URI with cough     Discharge Instructions      Ibuprofen and  tylenol, alternate every 6 hours.   Warm salt water gargles for throat pain, lozenges or spray.  Honey for cough, lots of fluids.  Please go to the emergency department if symptoms worsen.  ED Prescriptions   None    PDMP not reviewed this encounter.   Sharay Bellissimo, Ray Church 08/31/21 1539

## 2021-09-07 ENCOUNTER — Ambulatory Visit: Payer: 59 | Admitting: Student

## 2021-09-15 ENCOUNTER — Telehealth: Payer: 59 | Admitting: Family Medicine

## 2021-09-15 DIAGNOSIS — R058 Other specified cough: Secondary | ICD-10-CM

## 2021-09-15 MED ORDER — BENZONATATE 100 MG PO CAPS: 100.0000 mg | ORAL_CAPSULE | Freq: Two times a day (BID) | ORAL | 0 refills | Status: DC | PRN

## 2021-09-15 NOTE — Patient Instructions (Signed)
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
## 2021-11-16 ENCOUNTER — Telehealth: Payer: 59 | Admitting: Physician Assistant

## 2021-11-16 DIAGNOSIS — Z91199 Patient's noncompliance with other medical treatment and regimen due to unspecified reason: Secondary | ICD-10-CM

## 2021-11-18 NOTE — Progress Notes (Signed)
Patient never responded to questions asked by provider for more information. Encounter closed.

## 2021-12-06 ENCOUNTER — Encounter (HOSPITAL_COMMUNITY): Payer: Self-pay | Admitting: *Deleted

## 2021-12-06 ENCOUNTER — Ambulatory Visit (HOSPITAL_COMMUNITY)
Admission: EM | Admit: 2021-12-06 | Discharge: 2021-12-06 | Disposition: A | Discharge status: Home / Self Care | Payer: 59 | Attending: Internal Medicine | Admitting: Internal Medicine

## 2021-12-06 DIAGNOSIS — R058 Other specified cough: Secondary | ICD-10-CM

## 2021-12-06 DIAGNOSIS — J069 Acute upper respiratory infection, unspecified: Secondary | ICD-10-CM | POA: Diagnosis present

## 2021-12-06 DIAGNOSIS — U071 COVID-19: Secondary | ICD-10-CM | POA: Insufficient documentation

## 2021-12-06 DIAGNOSIS — J45909 Unspecified asthma, uncomplicated: Secondary | ICD-10-CM | POA: Diagnosis not present

## 2021-12-06 DIAGNOSIS — Z7951 Long term (current) use of inhaled steroids: Secondary | ICD-10-CM | POA: Insufficient documentation

## 2021-12-06 LAB — RESP PANEL BY RT-PCR (FLU A&B, COVID) ARPGX2
Influenza A by PCR: NEGATIVE
Influenza B by PCR: NEGATIVE
SARS Coronavirus 2 by RT PCR: POSITIVE — AB

## 2021-12-06 MED ORDER — IBUPROFEN 800 MG PO TABS
800.0000 mg | ORAL_TABLET | Freq: Once | ORAL | Status: AC
Start: 2021-12-06 — End: 2021-12-06
  Administered 2021-12-06: 800 mg via ORAL

## 2021-12-06 MED ORDER — BENZONATATE 100 MG PO CAPS: 100.0000 mg | ORAL_CAPSULE | Freq: Two times a day (BID) | ORAL | 0 refills | Status: DC | PRN

## 2021-12-06 MED ORDER — IBUPROFEN 800 MG PO TABS
ORAL_TABLET | ORAL | Status: AC
  Filled 2021-12-06: qty 1

## 2021-12-06 NOTE — ED Provider Notes (Signed)
[facility]    CSN: [id number] Arrival date & time: 12/06/21  1745      History   Chief Complaint Chief Complaint  Patient presents with   Fever    HPI Kathy [name] is a 26 y.o. female.   Patient presents urgent care for evaluation of cough, nasal congestion, headache, body aches, sore throat, and fever/chills that started Friday, December 04, 2021 (3 days ago).  Cough is productive with thick sputum.  Nasal congestion is clear/purulent and thick.  Fever was 104 this afternoon when she woke up from her nap.  Last night temperature was 99.0.  Sore throat is worse with swallowing.  Headache is generalized and is currently an 8 on a scale of 0-10.  No blurry vision or decreased visual acuity/dizziness reported.  Denies nausea, vomiting, diarrhea, abdominal pain, new low back pain, ear pain, facial swelling, and recent antibiotic use.  She tested herself for COVID-19 at home yesterday and today and both test were negative.  She has a history of asthma and has heard some wheezing but this clears after she is able to cough up some phlegm.  Asthma is well controlled with as needed use of albuterol inhaler and she has not felt the need to use this while she has been sick.  She believes that she picked up a viral illness from the rehab facility where her grandmother is currently staying since she has been visiting her grandmother daily over the last few weeks.  No other known sick contacts.  She has been using liquid Mucinex over-the-counter to help relieve nasal congestion and cough with some relief.  She has also been using Tylenol and cough drops with some relief as well.   Fever   Past Medical History:  Diagnosis Date   Obesity    Strep throat     Patient Active Problem List   Diagnosis Date Noted   Hypertension 07/20/2021   Polymenorrhea 12/02/2018   Family history of breast cancer 03/31/2016   Menstrual cramps 03/31/2016   Binge eating 07/23/2015   Depression  07/23/2015   Eczema 06/22/2012   ELEVATED BLOOD PRESSURE 04/06/2007   Morbid obesity (HCC) 12/28/2006    Past Surgical History:  Procedure Laterality Date   TONSILLECTOMY  2007    OB History   No obstetric history on file.      Home Medications    Prior to Admission medications   Medication Sig Start Date End Date Taking? Authorizing Provider  EPINEPHrine 0.3 mg/0.3 mL IJ SOAJ injection Inject 0.3 mLs (0.3 mg total) into the muscle as needed for anaphylaxis. 01/09/19  Yes Latrelle Dodrill, MD  albuterol (VENTOLIN HFA) 108 (90 Base) MCG/ACT inhaler Inhale 4 puffs into the lungs every 4 (four) hours as needed for wheezing or shortness of breath. 07/14/21   Latrelle Dodrill, MD  benzonatate (TESSALON) 100 MG capsule Take 1 capsule (100 mg total) by mouth 2 (two) times daily as needed for cough. 12/06/21   Carlisle Beers, FNP  hydrochlorothiazide (HYDRODIURIL) 12.5 MG tablet Take 1 tablet (12.5 mg total) by mouth daily. 07/14/21   Latrelle Dodrill, MD  ibuprofen (ADVIL) 800 MG tablet Take 1 tablet (800 mg total) by mouth 3 (three) times daily. 08/01/20   Georgetta Haber, NP  medroxyPROGESTERone (PROVERA) 10 MG tablet Take 1 tablet (10 mg total) by mouth daily. 07/14/21   Latrelle Dodrill, MD    Family History Family History  Problem Relation Age of Onset  Breast cancer Mother 58   Diabetes Father    Diabetes Paternal Grandmother    Diabetes Paternal Grandfather    Cervical cancer Maternal Grandmother    Cervical cancer Maternal Aunt     Social History Social History   Tobacco Use   Smoking status: Never   Smokeless tobacco: Never  Vaping Use   Vaping Use: Never used  Substance Use Topics   Alcohol use: No   Drug use: No     Allergies   Mushroom extract complex   Review of Systems Review of Systems  Constitutional:  Positive for fever.  Per HPI   Physical Exam Triage Vital Signs ED Triage Vitals  Enc Vitals Group     BP 12/06/21 1821 (!)  142/91     Pulse Rate 12/06/21 1821 (!) 112     Resp 12/06/21 1821 20     Temp 12/06/21 1821 98.5 F (36.9 C)     Temp Source 12/06/21 1821 Oral     SpO2 12/06/21 1821 96 %     Weight --      Height --      Head Circumference --      Peak Flow --      Pain Score 12/06/21 1819 0     Pain Loc --      Pain Edu? --      Excl. in GC? --    No data found.  Updated Vital Signs BP (!) 142/91 (BP Location: Right Arm)   Pulse (!) 112   Temp 98.5 F (36.9 C) (Oral)   Resp 20   LMP 11/10/2021 (Approximate)   SpO2 96%   Visual Acuity Right Eye Distance:   Left Eye Distance:   Bilateral Distance:    Right Eye Near:   Left Eye Near:    Bilateral Near:     Physical Exam Vitals and nursing note reviewed.  Constitutional:      Appearance: She is obese. She is not ill-appearing or toxic-appearing.  HENT:     Head: Normocephalic and atraumatic.     Right Ear: Hearing, tympanic membrane, ear canal and external ear normal.     Left Ear: Hearing, tympanic membrane, ear canal and external ear normal.     Nose: Congestion and rhinorrhea present.     Mouth/Throat:     Lips: Pink.     Mouth: Mucous membranes are moist.     Pharynx: Posterior oropharyngeal erythema present.  Eyes:     General: Lids are normal. Vision grossly intact. Gaze aligned appropriately.        Right eye: No discharge.        Left eye: No discharge.     Extraocular Movements: Extraocular movements intact.     Conjunctiva/sclera: Conjunctivae normal.     Pupils: Pupils are equal, round, and reactive to light.  Cardiovascular:     Rate and Rhythm: Normal rate and regular rhythm.     Heart sounds: Normal heart sounds, S1 normal and S2 normal.  Pulmonary:     Effort: Pulmonary effort is normal. No respiratory distress.     Breath sounds: Normal breath sounds and air entry.     Comments: No adventitious lung sounds are to auscultation.  Pulmonary effort is normal and without respiratory distress. Abdominal:      General: Abdomen is flat. Bowel sounds are normal.  Musculoskeletal:     Cervical back: Neck supple.  Lymphadenopathy:     Cervical: Cervical adenopathy present.  Skin:  General: Skin is warm and dry.     Capillary Refill: Capillary refill takes less than 2 seconds.     Findings: No rash.  Neurological:     General: No focal deficit present.     Mental Status: She is alert and oriented to person, place, and time. Mental status is at baseline.     Cranial Nerves: No dysarthria or facial asymmetry.     Motor: No weakness.  Psychiatric:        Mood and Affect: Mood normal.        Speech: Speech normal.        Behavior: Behavior normal.        Thought Content: Thought content normal.        Judgment: Judgment normal.      UC Treatments / Results  Labs (all labs ordered are listed, but only abnormal results are displayed) Labs Reviewed  RESP PANEL BY RT-PCR (FLU A&B, COVID) ARPGX2    EKG   Radiology No results found.  Procedures Procedures (including critical care time)  Medications Ordered in UC Medications  ibuprofen (ADVIL) tablet 800 mg (800 mg Oral Given 12/06/21 1907)    Initial Impression / Assessment and Plan / UC Course  I have reviewed the triage vital signs and the nursing notes.  Pertinent labs & imaging results that were available during my care of the patient were reviewed by me and considered in my medical decision making (see chart for details).   Viral URI with cough  Viral URI with cough Symptoms and physical exam consistent with a viral upper respiratory tract infection that will likely resolve with rest, fluids, and prescriptions for symptomatic relief. No indication for imaging today based on stable cardiopulmonary exam and hemodynamically stable vital signs.  COVID-19 and flu testing is pending.  We will call patient if this is positive.  Quarantine guidelines discussed. Currently on day 3 of symptoms and thus qualify for antiviral therapy.    Patient given ibuprofen 800 mg in clinic today for generalized body aches and headache.  Tessalon Perles sent to pharmacy for symptomatic relief to be taken as prescribed.  She may continue using over-the-counter medications to help with nasal congestion and fever/chills as needed for further symptomatic relief.  May use ibuprofen/tylenol over the counter for body aches, fever/chills, and overall discomfort associated with viral illness. Nonpharmacologic interventions for symptom relief provided and after visit summary below.   Strict ED/urgent care return precautions given.  Patient verbalizes understanding and agreement with plan.  Counseled patient regarding possible side effects and uses of all medications prescribed at today's visit.  Patient verbalizes understanding and agreement with plan.  All questions answered.  Patient discharged from urgent care in stable condition.        Final Clinical Impressions(s) / UC Diagnoses   Final diagnoses:  Viral URI with cough     Discharge Instructions      You have a viral upper respiratory infection.  COVID-19 and flu testing is pending.  We will call you if these results are positive.  You are a candidate for Paxlovid prescription if you do have COVID-19 and we will send it to the pharmacy if necessary.  This is an antiviral medication that can help reduce the chance of severe COVID-19 illness since you have asthma.  Continue taking over-the-counter Mucinex to help thin your phlegm so that you are able to blow out of your nose and cough it up easier. Take tessalon pearles every 8  hours as needed for cough.  You may take tylenol 1,000mg  and ibuprofen 600mg  every 6 hours with food as needed for fever/chills, sore throat, aches/pains, and inflammation associated with viral illness. Take this with food to avoid stomach upset.    You may do salt water and baking soda gargles every 4 hours as needed for your throat pain.  Please put 1 teaspoon  of salt and 1/2 teaspoon of baking soda in 8 ounces of warm water then gargle and spit the water out. You may also put 1 tablespoon of honey in warm water and drink this to soothe your throat.  Place a humidifier in your room at night to help decrease dry air that can irritate your airway and cause you to have a sore throat and cough.  Please try to eat a well-balanced diet while you are sick so that your body gets proper nutrition to heal.  If you develop any new or worsening symptoms, please return.  If your symptoms are severe, please go to the emergency room.  Follow-up with your primary care provider for further evaluation and management of your symptoms as well as ongoing wellness visits.  I hope you feel better!      ED Prescriptions     Medication Sig Dispense Auth. Provider   benzonatate (TESSALON) 100 MG capsule Take 1 capsule (100 mg total) by mouth 2 (two) times daily as needed for cough. 20 capsule , FNP      PDMP not reviewed this encounter.   Carlisle Beers, Carlisle Beers 12/06/21 [id number]

## 2021-12-06 NOTE — Discharge Instructions (Signed)
You have a viral upper respiratory infection.  COVID-19 and flu testing is pending.  We will call you if these results are positive.  You are a candidate for Paxlovid prescription if you do have COVID-19 and we will send it to the pharmacy if necessary.  This is an antiviral medication that can help reduce the chance of severe COVID-19 illness since you have asthma.  Continue taking over-the-counter Mucinex to help thin your phlegm so that you are able to blow out of your nose and cough it up easier. Take tessalon pearles every 8 hours as needed for cough.  You may take tylenol 1,000mg  and ibuprofen 600mg  every 6 hours with food as needed for fever/chills, sore throat, aches/pains, and inflammation associated with viral illness. Take this with food to avoid stomach upset.    You may do salt water and baking soda gargles every 4 hours as needed for your throat pain.  Please put 1 teaspoon of salt and 1/2 teaspoon of baking soda in 8 ounces of warm water then gargle and spit the water out. You may also put 1 tablespoon of honey in warm water and drink this to soothe your throat.  Place a humidifier in your room at night to help decrease dry air that can irritate your airway and cause you to have a sore throat and cough.  Please try to eat a well-balanced diet while you are sick so that your body gets proper nutrition to heal.  If you develop any new or worsening symptoms, please return.  If your symptoms are severe, please go to the emergency room.  Follow-up with your primary care provider for further evaluation and management of your symptoms as well as ongoing wellness visits.  I hope you feel better!

## 2021-12-06 NOTE — ED Triage Notes (Signed)
Pt states that her temp was 104 today. The sore throat, cough, congestion started Friday morning. She says when she coughs her stomach muscles hurt. She is taking day cough and cold meds and tylenol.

## 2021-12-07 ENCOUNTER — Telehealth (HOSPITAL_COMMUNITY): Payer: Self-pay | Admitting: Emergency Medicine

## 2021-12-07 MED ORDER — MOLNUPIRAVIR EUA 200MG CAPSULE: 4.0000 | ORAL_CAPSULE | Freq: Two times a day (BID) | ORAL | 0 refills | Status: AC

## 2022-01-25 ENCOUNTER — Telehealth: Payer: Managed Care, Other (non HMO) | Admitting: Family Medicine

## 2022-01-25 ENCOUNTER — Encounter: Payer: Self-pay | Admitting: Family Medicine

## 2022-01-25 DIAGNOSIS — R112 Nausea with vomiting, unspecified: Secondary | ICD-10-CM

## 2022-01-25 MED ORDER — ONDANSETRON HCL 4 MG PO TABS: 4.0000 mg | ORAL_TABLET | Freq: Three times a day (TID) | ORAL | 0 refills | Status: DC | PRN

## 2022-01-25 NOTE — Progress Notes (Signed)
E-Visit for Nausea and Vomiting   We are sorry that you are not feeling well. Here is how we plan to help!  Based on what you have shared with me it looks like you have a Virus that is irritating your GI tract.  Vomiting is the forceful emptying of a portion of the stomach's content through the mouth.  Although nausea and vomiting can make you feel miserable, it's important to remember that these are not diseases, but rather symptoms of an underlying illness.  When we treat short term symptoms, we always caution that any symptoms that persist should be fully evaluated in a medical office.  I have prescribed a medication that will help alleviate your symptoms and allow you to stay hydrated:  Zofran 4 mg 1 tablet every 8 hours as needed for nausea and vomiting  HOME CARE: Drink clear liquids.  This is very important! Dehydration (the lack of fluid) can lead to a serious complication.  Start off with 1 tablespoon every 5 minutes for 8 hours. You may begin eating bland foods after 8 hours without vomiting.  Start with saltine crackers, white bread, rice, mashed potatoes, applesauce. After 48 hours on a bland diet, you may resume a normal diet. Try to go to sleep.  Sleep often empties the stomach and relieves the need to vomit.  GET HELP RIGHT AWAY IF:  Your symptoms do not improve or worsen within 2 days after treatment. You have a fever for over 3 days. You cannot keep down fluids after trying the medication.  MAKE SURE YOU:  Understand these instructions. Will watch your condition. Will get help right away if you are not doing well or get worse.    Thank you for choosing an e-visit.  Your e-visit answers were reviewed by a board certified advanced clinical practitioner to complete your personal care plan. Depending upon the condition, your plan could have included both over the counter or prescription medications.  Please review your pharmacy choice. Make sure the pharmacy is open so  you can pick up prescription now. If there is a problem, you may contact your provider through MyChart messaging and have the prescription routed to another pharmacy.  Your safety is important to us. If you have drug allergies check your prescription carefully.   For the next 24 hours you can use MyChart to ask questions about today's visit, request a non-urgent call back, or ask for a work or school excuse. You will get an email in the next two days asking about your experience. I hope that your e-visit has been valuable and will speed your recovery.  I provided 5 minutes of non face-to-face time during this encounter for chart review, medication and order placement, as well as and documentation.   

## 2022-02-01 ENCOUNTER — Telehealth: Payer: 59 | Admitting: Physician Assistant

## 2022-02-01 DIAGNOSIS — R42 Dizziness and giddiness: Secondary | ICD-10-CM

## 2022-02-01 DIAGNOSIS — N946 Dysmenorrhea, unspecified: Secondary | ICD-10-CM

## 2022-02-01 DIAGNOSIS — N898 Other specified noninflammatory disorders of vagina: Secondary | ICD-10-CM

## 2022-02-01 NOTE — Progress Notes (Signed)
Because since you are feeling faint after passing clots and having increased cramping with vaginal discharge, I feel your condition warrants further evaluation and I recommend that you be seen in a face to face visit.   NOTE: There will be NO CHARGE for this eVisit   If you are having a true medical emergency please call 911.      For an urgent face to face visit,  has seven urgent care centers for your convenience:     [facility] at [facility] Directions [phone] [address] [place], [place] [address]    [facility] Center [facility]) Get Driving Directions [phone] [address] [place], [place] [address]  [facility]) Get Driving Directions [phone] [address] [place],  [place]  [address]  [facility] at [employer] Directions  [phone] [address] [place],  [place] [address]   [facility]gent Care at [facility] Get Driving Directions [phone] [address], [address] [place], [place] [address]   [facility] at [facility] Get Driving Directions  [phone] [address] [place], [place] [address]   [facility] at [facility] Directions [phone] [address] [place], [place] [address]  Your MyChart E-visit questionnaire answers were reviewed by a board certified advanced clinical practitioner to complete your personal care plan based on your specific symptoms.  Thank you for using e-Visits.   I have spent 5 minutes in review of e-visit questionnaire, review and updating patient chart, medical decision making and response to patient.   Margaretann Loveless, [name]

## 2022-05-11 ENCOUNTER — Telehealth: Payer: Managed Care, Other (non HMO) | Admitting: Physician Assistant

## 2022-05-11 DIAGNOSIS — R058 Other specified cough: Secondary | ICD-10-CM | POA: Diagnosis not present

## 2022-05-11 MED ORDER — PREDNISONE 20 MG PO TABS: 40.0000 mg | ORAL_TABLET | Freq: Every day | ORAL | 0 refills | Status: DC

## 2022-05-11 NOTE — Patient Instructions (Signed)
  Kathy Kathy [name], thank you for joining Kathy Rio, [name] for today's virtual visit.  While this provider is not your primary Kathy [name] provider (PCP), if your PCP is located in our provider database this encounter information will be shared with them immediately following your visit.   Kathy Kathy [name] account gives you access to today's visit and all your visits, tests, and labs performed at Kathy Kathy [name] " click here if you don't have a Kathy [name] account or go to [email or web address]  Consent: (Patient) Kathy Kathy [name] provided verbal consent for this virtual visit at the beginning of the encounter.  Current Medications:  Current Outpatient Medications:    albuterol (VENTOLIN HFA) 108 (90 Base) MCG/ACT inhaler, Inhale 4 puffs into the lungs every 4 (four) hours as needed for wheezing or shortness of breath., Disp: 18 g, Rfl: 0   benzonatate (TESSALON) 100 MG capsule, Take 1 capsule (100 mg total) by mouth 2 (two) times daily as needed for cough., Disp: 20 capsule, Rfl: 0   EPINEPHrine 0.3 mg/0.3 mL IJ SOAJ injection, Inject 0.3 mLs (0.3 mg total) into the muscle as needed for anaphylaxis., Disp: 1 each, Rfl: 1   hydrochlorothiazide (HYDRODIURIL) 12.5 MG tablet, Take 1 tablet (12.5 mg total) by mouth daily., Disp: 90 tablet, Rfl: 0   ibuprofen (ADVIL) 800 MG tablet, Take 1 tablet (800 mg total) by mouth 3 (three) times daily., Disp: 30 tablet, Rfl: 0   medroxyPROGESTERone (PROVERA) 10 MG tablet, Take 1 tablet (10 mg total) by mouth daily., Disp: 10 tablet, Rfl: 0   ondansetron (ZOFRAN) 4 MG tablet, Take 1 tablet (4 mg total) by mouth every 8 (eight) hours as needed for nausea or vomiting., Disp: 15 tablet, Rfl: 0   Medications ordered in this encounter:  No orders of the defined types were placed in this encounter.    *If you need refills on other medications prior to your next appointment, please contact your pharmacy*  Follow-Up: Call back or seek an  in-person evaluation if the symptoms worsen or if the condition fails to improve as anticipated.  Kathy [name] [phone]  Other Instructions Please keep well-hydrated and get plenty of rest. Ok to continue the Kathy Kathy [name]. You can use OTC Delsym as well if needed. Ok to continue your albuterol. Take the prednisone as directed. If not resolving or any new/worsening symptoms despite treatment, you need an in-person evaluation with your PCP.   If you have been instructed to have an in-person evaluation today at a local Urgent Kathy [name] facility, please use the link below. It will take you to a list of all of our available [name] Urgent Cares, including address, phone number and hours of operation. Please do not delay Kathy [name].  [name]  If you or a family member do not have a primary Kathy [name] provider, use the link below to schedule a visit and establish Kathy [name]. When you choose a [facility] primary [facility] [name] physician or advanced practice provider, you gain a long-term partner in health. Find a Primary Kathy [name] Provider  Learn more about [place]'s in-office and virtual Kathy [name] options: [facility]

## 2022-05-11 NOTE — Progress Notes (Signed)
Virtual Visit Consent   Kathy [name], you are scheduled for a virtual visit with a [facility] provider today. Just as with appointments in the office, your consent must be obtained to participate. Your consent will be active for this visit and any virtual visit you may have with one of our providers in the next 365 days. If you have a MyChart account, a copy of this consent can be sent to you electronically.  As this is a virtual visit, video technology does not allow for your provider to perform a traditional examination. This may limit your provider's ability to fully assess your condition. If your provider identifies any concerns that need to be evaluated in person or the need to arrange testing (such as labs, EKG, etc.), we will make arrangements to do so. Although advances in technology are sophisticated, we cannot ensure that it will always work on either your end or our end. If the connection with a video visit is poor, the visit may have to be switched to a telephone visit. With either a video or telephone visit, we are not always able to ensure that we have a secure connection.  By engaging in this virtual visit, you consent to the provision of healthcare and authorize for your insurance to be billed (if applicable) for the services provided during this visit. Depending on your insurance coverage, you may receive a charge related to this service.  I need to obtain your verbal consent now. Are you willing to proceed with your visit today? Kathy [name] has provided verbal consent on 05/11/2022 for a virtual visit (video or telephone). [name], [place]  Date: 05/11/2022 1:06 PM  Virtual Visit via Video Note   I, [name], connected with  Kathy [name]  ([id number], 1995-05-17) on 05/11/22 at  1:00 PM EST by a video-enabled telemedicine application and verified that I am speaking with the correct person using two identifiers.  Location: Patient: Virtual Visit Location  Patient: Home Provider: Virtual Visit Location Provider: Home Office   I discussed the limitations of evaluation and management by telemedicine and the availability of in person appointments. The patient expressed understanding and agreed to proceed.    History of Present Illness: Kathy [name] is a 27 y.o. who identifies as a female who was assigned female at birth, and is being seen today for residual cough after an upper respiratory illness. Notes 4 weeks ago -- congestion, cough that has mainly resolved but with residual dry cough. Cough is worse with exposure to air or temperature change. Denies heartburn or reflux with current symptoms.   Albuterol -- given at Urgent care.  HPI: HPI  Problems:  Patient Active Problem List   Diagnosis Date Noted   Hypertension 07/20/2021   Polymenorrhea 12/02/2018   Family history of breast cancer 03/31/2016   Menstrual cramps 03/31/2016   Binge eating 07/23/2015   Depression 07/23/2015   Eczema 06/22/2012   ELEVATED BLOOD PRESSURE 04/06/2007   Morbid obesity ([place]) 12/28/2006    Allergies:  Allergies  Allergen Reactions   Mushroom Extract Complex     Throat itching   Medications:  Current Outpatient Medications:    predniSONE (DELTASONE) 20 MG tablet, Take 2 tablets (40 mg total) by mouth daily with breakfast., Disp: 10 tablet, Rfl: 0   albuterol (VENTOLIN HFA) 108 (90 Base) MCG/ACT inhaler, Inhale 4 puffs into the lungs every 4 (four) hours as needed for wheezing or shortness of breath., Disp: 18 g, Rfl:  0   benzonatate (TESSALON) 100 MG capsule, Take 1 capsule (100 mg total) by mouth 2 (two) times daily as needed for cough., Disp: 20 capsule, Rfl: 0   EPINEPHrine 0.3 mg/0.3 mL IJ SOAJ injection, Inject 0.3 mLs (0.3 mg total) into the muscle as needed for anaphylaxis., Disp: 1 each, Rfl: 1   hydrochlorothiazide (HYDRODIURIL) 12.5 MG tablet, Take 1 tablet (12.5 mg total) by mouth daily., Disp: 90 tablet, Rfl: 0   ibuprofen (ADVIL) 800 MG  tablet, Take 1 tablet (800 mg total) by mouth 3 (three) times daily., Disp: 30 tablet, Rfl: 0   medroxyPROGESTERone (PROVERA) 10 MG tablet, Take 1 tablet (10 mg total) by mouth daily., Disp: 10 tablet, Rfl: 0   ondansetron (ZOFRAN) 4 MG tablet, Take 1 tablet (4 mg total) by mouth every 8 (eight) hours as needed for nausea or vomiting., Disp: 15 tablet, Rfl: 0  Observations/Objective: Patient is well-developed, well-nourished in no acute distress.  Resting comfortably at home.  Head is normocephalic, atraumatic.  No labored breathing. Speech is clear and coherent with logical content.  Patient is alert and oriented at baseline.  Assessment and Plan: 1. Upper airway cough syndrome - predniSONE (DELTASONE) 20 MG tablet; Take 2 tablets (40 mg total) by mouth daily with breakfast.  Dispense: 10 tablet; Refill: 0  Increase fluids. Ok to continue tessalon and albuterol. Will start 5-day burst of prednisone. Will need in-person follow-up if not resolving with these measures.  Follow Up Instructions: I discussed the assessment and treatment plan with the patient. The patient was provided an opportunity to ask questions and all were answered. The patient agreed with the plan and demonstrated an understanding of the instructions.  A copy of instructions were sent to the patient via MyChart unless otherwise noted below.   The patient was advised to call back or seek an in-person evaluation if the symptoms worsen or if the condition fails to improve as anticipated.  Time:  I spent 10 minutes with the patient via telehealth technology discussing the above problems/concerns.    [name], [name]

## 2022-06-08 ENCOUNTER — Telehealth: Payer: Managed Care, Other (non HMO) | Admitting: Physician Assistant

## 2022-06-08 DIAGNOSIS — J208 Acute bronchitis due to other specified organisms: Secondary | ICD-10-CM

## 2022-06-08 DIAGNOSIS — B9689 Other specified bacterial agents as the cause of diseases classified elsewhere: Secondary | ICD-10-CM | POA: Diagnosis not present

## 2022-06-08 MED ORDER — AZITHROMYCIN 250 MG PO TABS
ORAL_TABLET | ORAL | 0 refills | Status: DC
Start: 2022-06-08 — End: 2022-06-08

## 2022-06-08 MED ORDER — BENZONATATE 100 MG PO CAPS: 100.0000 mg | ORAL_CAPSULE | Freq: Three times a day (TID) | ORAL | 0 refills | Status: DC | PRN

## 2022-06-08 MED ORDER — AZITHROMYCIN 250 MG PO TABS
ORAL_TABLET | ORAL | 0 refills | Status: AC
Start: 2022-06-08 — End: 2022-06-13

## 2022-06-08 MED ORDER — ALBUTEROL SULFATE HFA 108 (90 BASE) MCG/ACT IN AERS
4.0000 | INHALATION_SPRAY | RESPIRATORY_TRACT | 0 refills | Status: DC | PRN
Start: 2022-06-08 — End: 2023-05-20

## 2022-06-08 MED ORDER — PROMETHAZINE-DM 6.25-15 MG/5ML PO SYRP
5.0000 mL | ORAL_SOLUTION | Freq: Four times a day (QID) | ORAL | 0 refills | Status: DC | PRN
Start: 2022-06-08 — End: 2022-09-23

## 2022-06-08 MED ORDER — PREDNISONE 20 MG PO TABS
40.0000 mg | ORAL_TABLET | Freq: Every day | ORAL | 0 refills | Status: DC
Start: 2022-06-08 — End: 2022-09-23

## 2022-06-08 MED ORDER — PREDNISONE 20 MG PO TABS: 40.0000 mg | ORAL_TABLET | Freq: Every day | ORAL | 0 refills | Status: DC

## 2022-06-08 NOTE — Progress Notes (Signed)
Pharmacy did not receive 2 of the medications prescribed. Resent. Visit marked no charge due to earlier visit today

## 2022-06-08 NOTE — Progress Notes (Signed)
I have spent 5 minutes in review of e-visit questionnaire, review and updating patient chart, medical decision making and response to patient.   Erice Ahles Cody Arham Symmonds, [name]    

## 2022-06-08 NOTE — Progress Notes (Signed)
Virtual Visit Consent   Kathy [name], you are scheduled for a virtual visit with a [facility] provider today. Just as with appointments in the office, your consent must be obtained to participate. Your consent will be active for this visit and any virtual visit you may have with one of our providers in the next 365 days. If you have a MyChart account, a copy of this consent can be sent to you electronically.  As this is a virtual visit, video technology does not allow for your provider to perform a traditional examination. This may limit your provider's ability to fully assess your condition. If your provider identifies any concerns that need to be evaluated in person or the need to arrange testing (such as labs, EKG, etc.), we will make arrangements to do so. Although advances in technology are sophisticated, we cannot ensure that it will always work on either your end or our end. If the connection with a video visit is poor, the visit may have to be switched to a telephone visit. With either a video or telephone visit, we are not always able to ensure that we have a secure connection.  By engaging in this virtual visit, you consent to the provision of healthcare and authorize for your insurance to be billed (if applicable) for the services provided during this visit. Depending on your insurance coverage, you may receive a charge related to this service.  I need to obtain your verbal consent now. Are you willing to proceed with your visit today? Kathy [name] has provided verbal consent on 06/08/2022 for a virtual visit (video or telephone). [name], [name]  Date: 06/08/2022 6:26 PM  Virtual Visit via Video Note   I, [name], connected with  Kathy [name]  ([phone], [birth year]) on 06/08/22 at  6:15 PM EDT by a video-enabled telemedicine application and verified that I am speaking with the correct person using two identifiers.  Location: Patient: Virtual Visit Location  Patient: Home Provider: Virtual Visit Location Provider: Home Office   I discussed the limitations of evaluation and management by telemedicine and the availability of in person appointments. The patient expressed understanding and agreed to proceed.    History of Present Illness: Kathy [name] is a 27 y.o. who identifies as a female who was assigned female at birth, and is being seen today for cough.  HPI: Cough This is a recurrent problem. The current episode started 1 to 4 weeks ago (Was sick around early March, improved, but never fully and over the last week or so has been worsening again). The problem has been gradually worsening. The problem occurs hourly. The cough is Productive of sputum. Associated symptoms include chest pain (heaviness), headaches, postnasal drip, shortness of breath and wheezing. Pertinent negatives include no chills, nasal congestion, rhinorrhea, sore throat or sweats. The symptoms are aggravated by lying down. She has tried OTC cough suppressant for the symptoms. The treatment provided no relief.   Completed EV and had prednisone 40mg  x 5 days, albuterol, and tessalon perles prescribed just today.  Problems:  Patient Active Problem List   Diagnosis Date Noted   Hypertension 07/20/2021   Polymenorrhea 12/02/2018   Family history of breast cancer 03/31/2016   Menstrual cramps 03/31/2016   Binge eating 07/23/2015   Depression 07/23/2015   Eczema 06/22/2012   ELEVATED BLOOD PRESSURE 04/06/2007   Morbid obesity ([place]) 12/28/2006    Allergies:  Allergies  Allergen Reactions   Mushroom Extract Complex  Throat itching   Medications:  Current Outpatient Medications:    azithromycin (ZITHROMAX) 250 MG tablet, Take 2 tablets on day 1, then 1 tablet daily on days 2 through 5, Disp: 6 tablet, Rfl: 0   promethazine-dextromethorphan (PROMETHAZINE-DM) 6.25-15 MG/5ML syrup, Take 5 mLs by mouth 4 (four) times daily as needed., Disp: 118 mL, Rfl: 0   albuterol  (VENTOLIN HFA) 108 (90 Base) MCG/ACT inhaler, Inhale 4 puffs into the lungs every 4 (four) hours as needed for wheezing or shortness of breath., Disp: 18 g, Rfl: 0   benzonatate (TESSALON) 100 MG capsule, Take 1 capsule (100 mg total) by mouth 3 (three) times daily as needed for cough., Disp: 30 capsule, Rfl: 0   EPINEPHrine 0.3 mg/0.3 mL IJ SOAJ injection, Inject 0.3 mLs (0.3 mg total) into the muscle as needed for anaphylaxis., Disp: 1 each, Rfl: 1   hydrochlorothiazide (HYDRODIURIL) 12.5 MG tablet, Take 1 tablet (12.5 mg total) by mouth daily., Disp: 90 tablet, Rfl: 0   ibuprofen (ADVIL) 800 MG tablet, Take 1 tablet (800 mg total) by mouth 3 (three) times daily., Disp: 30 tablet, Rfl: 0   ondansetron (ZOFRAN) 4 MG tablet, Take 1 tablet (4 mg total) by mouth every 8 (eight) hours as needed for nausea or vomiting., Disp: 15 tablet, Rfl: 0   predniSONE (DELTASONE) 20 MG tablet, Take 2 tablets (40 mg total) by mouth daily with breakfast., Disp: 10 tablet, Rfl: 0  Observations/Objective: Patient is well-developed, well-nourished in no acute distress.  Resting comfortably at home.  Head is normocephalic, atraumatic.  No labored breathing.  Speech is clear and coherent with logical content.  Patient is alert and oriented at baseline.    Assessment and Plan: 1. Acute bacterial bronchitis - promethazine-dextromethorphan (PROMETHAZINE-DM) 6.25-15 MG/5ML syrup; Take 5 mLs by mouth 4 (four) times daily as needed.  Dispense: 118 mL; Refill: 0 - azithromycin (ZITHROMAX) 250 MG tablet; Take 2 tablets on day 1, then 1 tablet daily on days 2 through 5  Dispense: 6 tablet; Refill: 0  - Worsening over a week despite OTC medications - Will treat with Z-pack and Promethazine DM - Can continue Mucinex  - Push fluids.  - Rest.  - Steam and humidifier can help - Seek in person evaluation if worsening or symptoms fail to improve    Follow Up Instructions: I discussed the assessment and treatment plan with  the patient. The patient was provided an opportunity to ask questions and all were answered. The patient agreed with the plan and demonstrated an understanding of the instructions.  A copy of instructions were sent to the patient via MyChart unless otherwise noted below.    The patient was advised to call back or seek an in-person evaluation if the symptoms worsen or if the condition fails to improve as anticipated.  Time:  I spent 10 minutes with the patient via telehealth technology discussing the above problems/concerns.    [name], [name]

## 2022-06-08 NOTE — Patient Instructions (Signed)
Kathy [name], thank you for joining [name], [name] for today's virtual visit.  While this provider is not your primary [name] provider (PCP), if your PCP is located in our provider database this encounter information will be shared with them immediately following your visit.   [facility] account gives you access to today's visit and all your visits, tests, and labs performed at [facility] " click here if you don't have a [facility] account or go to [email or web address]  Consent: (Patient) Kathy [name] provided verbal consent for this virtual visit at the beginning of the encounter.  Current Medications:  Current Outpatient Medications:    azithromycin (ZITHROMAX) 250 MG tablet, Take 2 tablets on day 1, then 1 tablet daily on days 2 through 5, Disp: 6 tablet, Rfl: 0   promethazine-dextromethorphan (PROMETHAZINE-DM) 6.25-15 MG/5ML syrup, Take 5 mLs by mouth 4 (four) times daily as needed., Disp: 118 mL, Rfl: 0   albuterol (VENTOLIN HFA) 108 (90 Base) MCG/ACT inhaler, Inhale 4 puffs into the lungs every 4 (four) hours as needed for wheezing or shortness of breath., Disp: 18 g, Rfl: 0   benzonatate (TESSALON) 100 MG capsule, Take 1 capsule (100 mg total) by mouth 3 (three) times daily as needed for cough., Disp: 30 capsule, Rfl: 0   EPINEPHrine 0.3 mg/0.3 mL IJ SOAJ injection, Inject 0.3 mLs (0.3 mg total) into the muscle as needed for anaphylaxis., Disp: 1 each, Rfl: 1   hydrochlorothiazide (HYDRODIURIL) 12.5 MG tablet, Take 1 tablet (12.5 mg total) by mouth daily., Disp: 90 tablet, Rfl: 0   ibuprofen (ADVIL) 800 MG tablet, Take 1 tablet (800 mg total) by mouth 3 (three) times daily., Disp: 30 tablet, Rfl: 0   ondansetron (ZOFRAN) 4 MG tablet, Take 1 tablet (4 mg total) by mouth every 8 (eight) hours as needed for nausea or vomiting., Disp: 15 tablet, Rfl: 0   predniSONE (DELTASONE) 20 MG tablet, Take 2 tablets (40 mg total) by mouth daily with  breakfast., Disp: 10 tablet, Rfl: 0   Medications ordered in this encounter:  Meds ordered this encounter  Medications   promethazine-dextromethorphan (PROMETHAZINE-DM) 6.25-15 MG/5ML syrup    Sig: Take 5 mLs by mouth 4 (four) times daily as needed.    Dispense:  118 mL    Refill:  0    Order Specific Question:   Supervising Provider    Answer:   Chase Picket [id number]   azithromycin (ZITHROMAX) 250 MG tablet    Sig: Take 2 tablets on day 1, then 1 tablet daily on days 2 through 5    Dispense:  6 tablet    Refill:  0    Order Specific Question:   Supervising Provider    Answer:   Chase Picket [id number]     *If you need refills on other medications prior to your next appointment, please contact your pharmacy*  Follow-Up: Call back or seek an in-person evaluation if the symptoms worsen or if the condition fails to improve as anticipated.  [facility] [phone]  Other Instructions  Acute Bronchitis, Adult  Acute bronchitis is sudden inflammation of the main airways (bronchi) that come off the windpipe (trachea) in the lungs. The swelling causes the airways to get smaller and make more mucus than normal. This can make it hard to breathe and can cause coughing or noisy breathing (wheezing). Acute bronchitis may last several weeks. The cough may last longer. Allergies, asthma, and exposure to  smoke may make the condition worse. What are the causes? This condition can be caused by germs and by substances that irritate the lungs, including: Cold and flu viruses. The most common cause of this condition is the virus that causes the common cold. Bacteria. This is less common. Breathing in substances that irritate the lungs, including: Smoke from cigarettes and other forms of tobacco. Dust and pollen. Fumes from household cleaning products, gases, or burned fuel. Indoor or outdoor air pollution. What increases the risk? The following factors may make you more  likely to develop this condition: A weak body's defense system, also called the immune system. A condition that affects your lungs and breathing, such as asthma. What are the signs or symptoms? Common symptoms of this condition include: Coughing. This may bring up clear, yellow, or green mucus from your lungs (sputum). Wheezing. Runny or stuffy nose. Having too much mucus in your lungs (chest congestion). Shortness of breath. Aches and pains, including sore throat or chest. How is this diagnosed? This condition is usually diagnosed based on: Your symptoms and medical history. A physical exam. You may also have other tests, including tests to rule out other conditions, such as pneumonia. These tests include: A test of lung function. Test of a mucus sample to look for the presence of bacteria. Tests to check the oxygen level in your blood. Blood tests. Chest X-ray. How is this treated? Most cases of acute bronchitis clear up over time without treatment. Your health [name] provider may recommend: Drinking more fluids to help thin your mucus so it is easier to cough up. Taking inhaled medicine (inhaler) to improve air flow in and out of your lungs. Using a vaporizer or a humidifier. These are machines that add water to the air to help you breathe better. Taking a medicine that thins mucus and clears congestion (expectorant). Taking a medicine that prevents or stops coughing (cough suppressant). It is not common to take an antibiotic medicine for this condition. Follow these instructions at home:  Take over-the-counter and prescription medicines only as told by your health [name] provider. Use an inhaler, vaporizer, or humidifier as told by your health [name] provider. Take two teaspoons (10 mL) of honey at bedtime to lessen coughing at night. Drink enough fluid to keep your urine pale yellow. Do not use any products that contain nicotine or tobacco. These products include cigarettes, chewing  tobacco, and vaping devices, such as e-cigarettes. If you need help quitting, ask your health [name] provider. Get plenty of rest. Return to your normal activities as told by your health [name] provider. Ask your health [name] provider what activities are safe for you. Keep all follow-up visits. This is important. How is this prevented? To lower your risk of getting this condition again: Wash your hands often with soap and water for at least 20 seconds. If soap and water are not available, use hand sanitizer. Avoid contact with people who have cold symptoms. Try not to touch your mouth, nose, or eyes with your hands. Avoid breathing in smoke or chemical fumes. Breathing smoke or chemical fumes will make your condition worse. Get the flu shot every year. Contact a health [name] provider if: Your symptoms do not improve after 2 weeks. You have trouble coughing up the mucus. Your cough keeps you awake at night. You have a fever. Get help right away if you: Cough up blood. Feel pain in your chest. Have severe shortness of breath. Faint or keep feeling like you are  going to faint. Have a severe headache. Have a fever or chills that get worse. These symptoms may represent a serious problem that is an emergency. Do not wait to see if the symptoms will go away. Get medical help right away. Call your local emergency services (911 in the [place] Do not drive yourself to the hospital. Summary Acute bronchitis is inflammation of the main airways (bronchi) that come off the windpipe (trachea) in the lungs. The swelling causes the airways to get smaller and make more mucus than normal. Drinking more fluids can help thin your mucus so it is easier to cough up. Take over-the-counter and prescription medicines only as told by your health [name] provider. Do not use any products that contain nicotine or tobacco. These products include cigarettes, chewing tobacco, and vaping devices, such as e-cigarettes. If you need  help quitting, ask your health [name] provider. Contact a health [name] provider if your symptoms do not improve after 2 weeks. This information is not intended to replace advice given to you by your health [name] provider. Make sure you discuss any questions you have with your health [name] provider. Document Revised: 06/04/2021 Document Reviewed: 06/25/2020 Elsevier Patient Education  [place].    If you have been instructed to have an in-person evaluation today at a local Urgent [name] facility, please use the link below. It will take you to a list of all of our available [facility], including address, phone number and hours of operation. Please do not delay [name].  [facility]  If you or a family member do not have a primary [name] provider, use the link below to schedule a visit and establish [name]. When you choose a [facility] primary [name] physician or advanced practice provider, you gain a long-term partner in health. Find a Primary [name] Provider  Learn more about [facility]'s in-office and virtual [name] options: [facility]

## 2022-06-08 NOTE — Progress Notes (Signed)
E-Visit for Cough  We are sorry that you are not feeling well.  Here is how we plan to help!  Based on your presentation I believe you most likely have A cough due to a virus.  This is called viral bronchitis and is best treated by rest, plenty of fluids and control of the cough.  You may use Ibuprofen or Tylenol as directed to help your symptoms.     In addition you may use A prescription cough medication called Tessalon Perles 100mg . You may take 1-2 capsules every 8 hours as needed for your cough.  I have sent in a refill of a rescue inhaler (albuterol) to use as directed when needed, along with short course of prednisone.   From your responses in the eVisit questionnaire you describe inflammation in the upper respiratory tract which is causing a significant cough.  This is commonly called Bronchitis and has four common causes:   Allergies Viral Infections Acid Reflux Bacterial Infection Allergies, viruses and acid reflux are treated by controlling symptoms or eliminating the cause. An example might be a cough caused by taking certain blood pressure medications. You stop the cough by changing the medication. Another example might be a cough caused by acid reflux. Controlling the reflux helps control the cough.  USE OF BRONCHODILATOR ("RESCUE") INHALERS: There is a risk from using your bronchodilator too frequently.  The risk is that over-reliance on a medication which only relaxes the muscles surrounding the breathing tubes can reduce the effectiveness of medications prescribed to reduce swelling and congestion of the tubes themselves.  Although you feel brief relief from the bronchodilator inhaler, your asthma may actually be worsening with the tubes becoming more swollen and filled with mucus.  This can delay other crucial treatments, such as oral steroid medications. If you need to use a bronchodilator inhaler daily, several times per day, you should discuss this with your provider.  There  are probably better treatments that could be used to keep your asthma under control.     HOME CARE Only take medications as instructed by your medical team. Complete the entire course of an antibiotic. Drink plenty of fluids and get plenty of rest. Avoid close contacts especially the very young and the elderly Cover your mouth if you cough or cough into your sleeve. Always remember to wash your hands A steam or ultrasonic humidifier can help congestion.   GET HELP RIGHT AWAY IF: You develop worsening fever. You become short of breath You cough up blood. Your symptoms persist after you have completed your treatment plan MAKE SURE YOU  Understand these instructions. Will watch your condition. Will get help right away if you are not doing well or get worse.    Thank you for choosing an e-visit.  Your e-visit answers were reviewed by a board certified advanced clinical practitioner to complete your personal care plan. Depending upon the condition, your plan could have included both over the counter or prescription medications.  Please review your pharmacy choice. Make sure the pharmacy is open so you can pick up prescription now. If there is a problem, you may contact your provider through [employer] and have the prescription routed to another pharmacy.  Your safety is important to [place]. If you have drug allergies check your prescription carefully.   For the next 24 hours you can use MyChart to ask questions about today's visit, request a non-urgent call back, or ask for a work or school excuse. You will get an  email in the next two days asking about your experience. I hope that your e-visit has been valuable and will speed your recovery.

## 2022-09-06 IMAGING — DX DG HIP (WITH OR WITHOUT PELVIS) 2-3V*R*
3 series · 3 of 3 positions shown · non-contrast
Comparison: None

CLINICAL DATA: RIGHT hip pain since this morning, fell while
putting on her clothes

EXAM:
DG HIP (WITH OR WITHOUT PELVIS) 2-3V RIGHT

[pelvis ap]
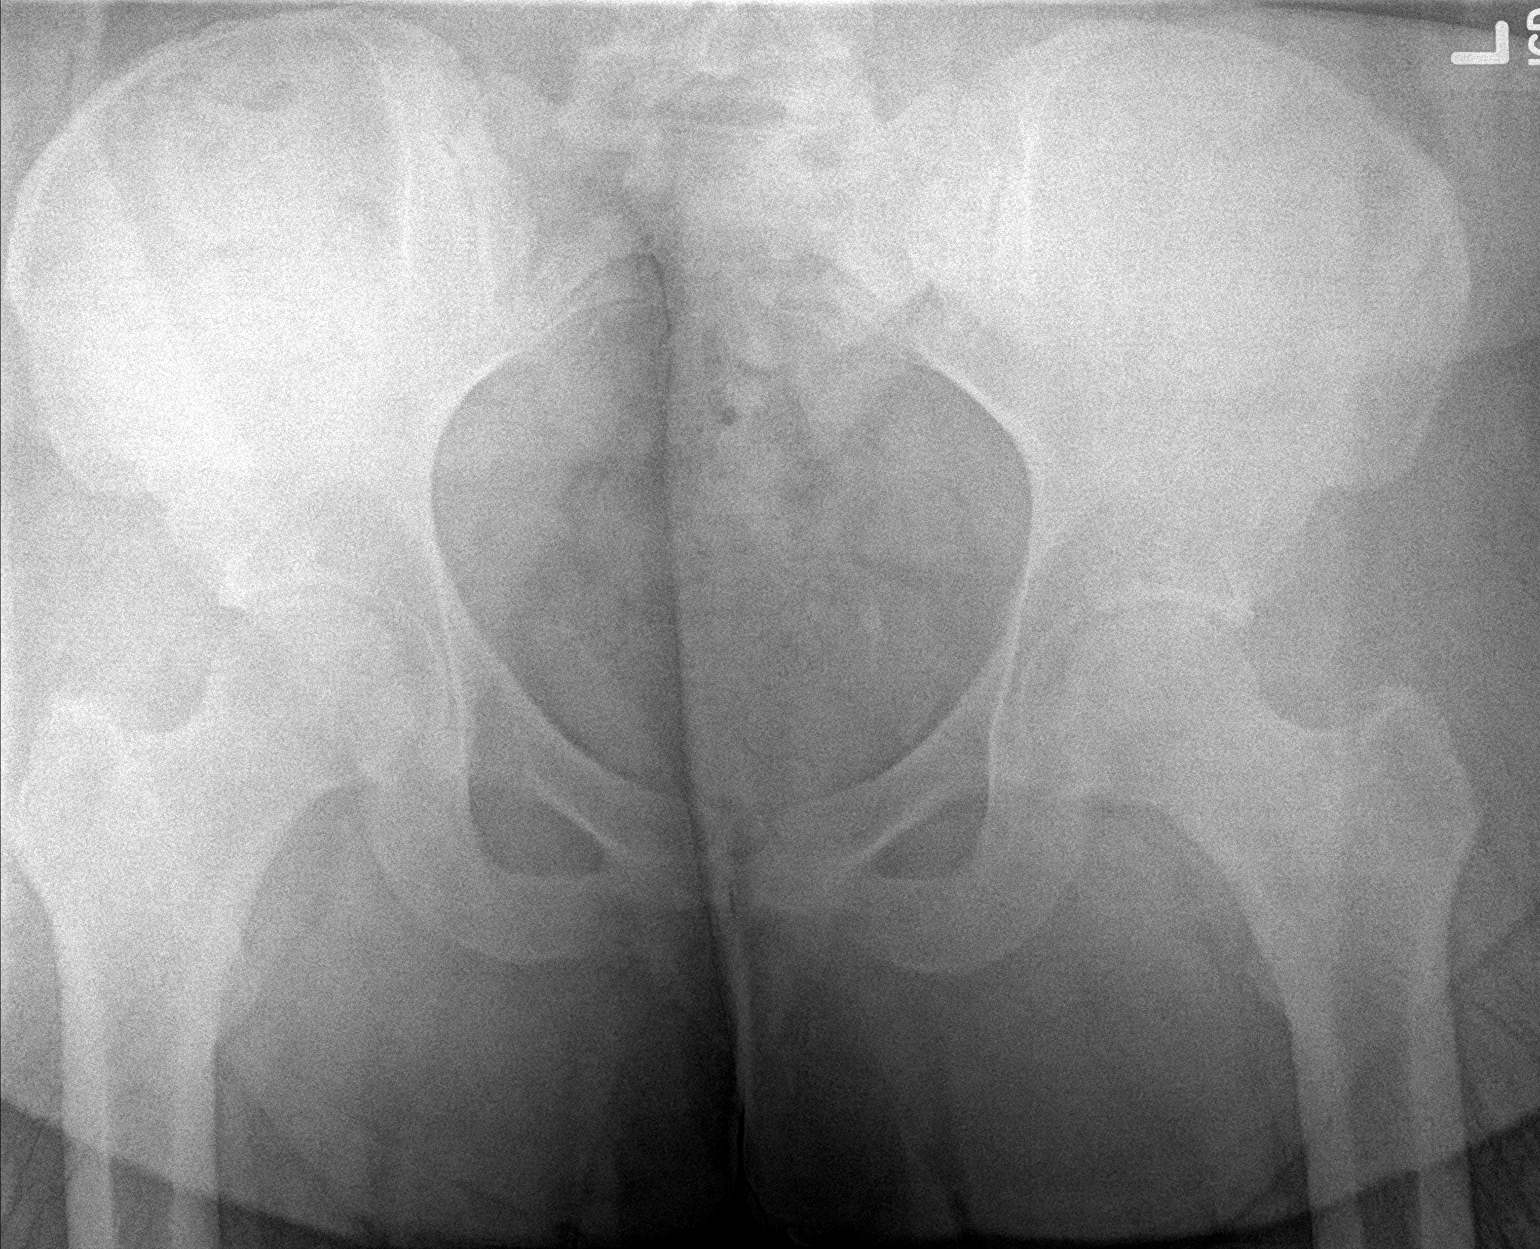

[hip ap]
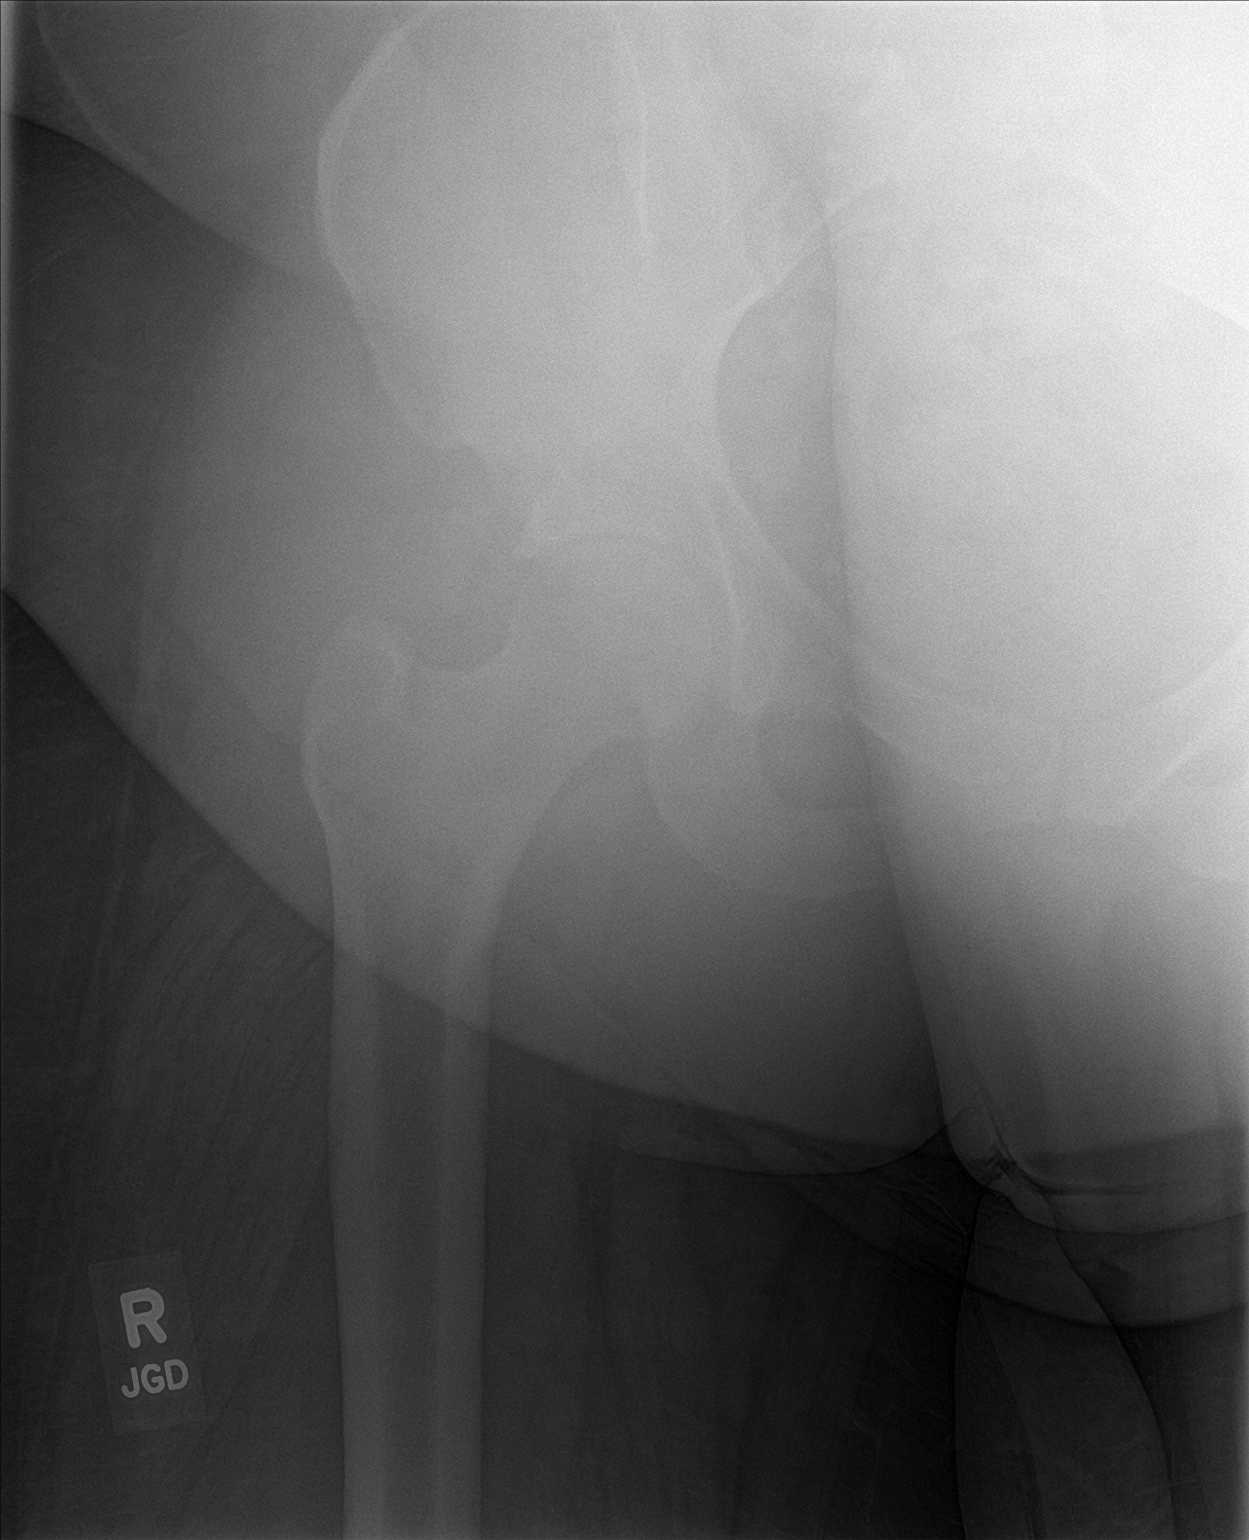

[hip lat]
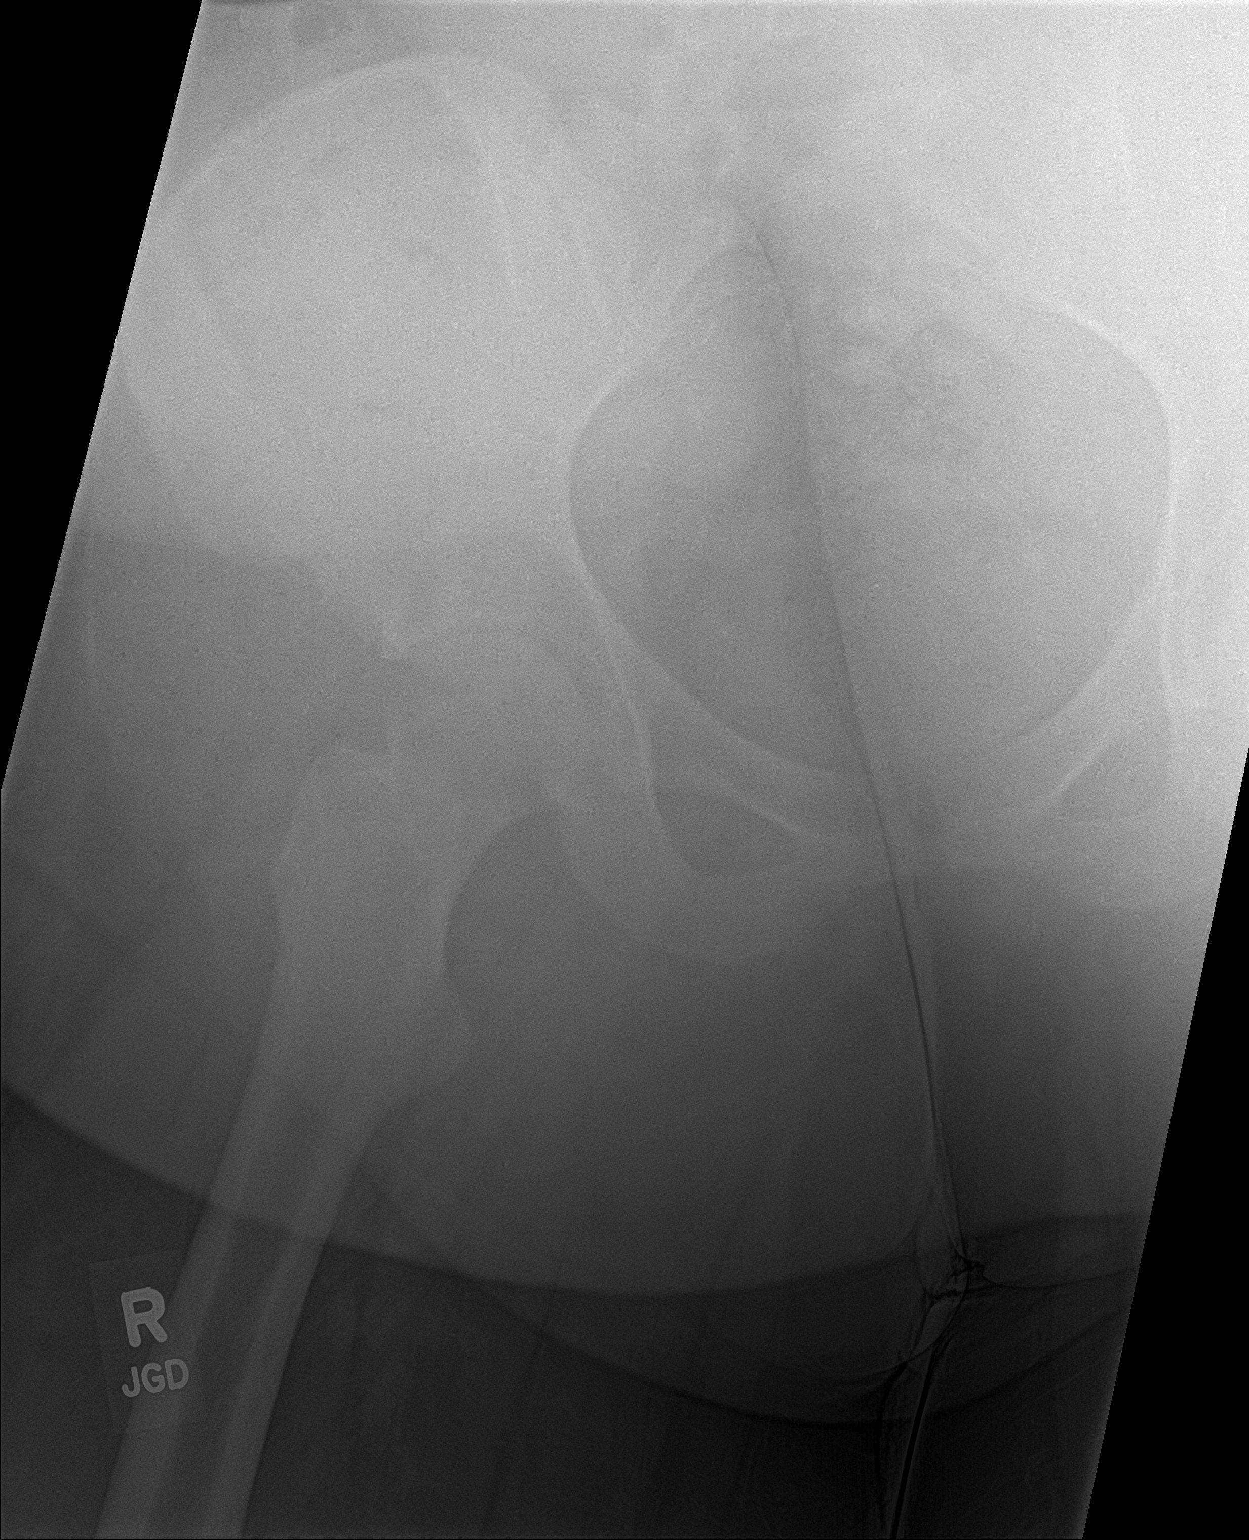

[3 of 3 positions shown; findings below may reference images not displayed]

FINDINGS: Osseous mineralization normal.

Hip and SI joint spaces preserved.

No acute fracture, dislocation, or bone destruction.
IMPRESSION: Normal exam.

## 2022-09-23 ENCOUNTER — Ambulatory Visit: Payer: BC Managed Care – PPO | Admitting: Family Medicine

## 2022-09-23 ENCOUNTER — Other Ambulatory Visit: Payer: Self-pay

## 2022-09-23 ENCOUNTER — Encounter: Payer: Self-pay | Admitting: Family Medicine

## 2022-09-23 VITALS — BP 150/97 | HR 92 | Ht 67.0 in | Wt >= 6400 oz

## 2022-09-23 DIAGNOSIS — Z1159 Encounter for screening for other viral diseases: Secondary | ICD-10-CM | POA: Diagnosis not present

## 2022-09-23 DIAGNOSIS — I10 Essential (primary) hypertension: Secondary | ICD-10-CM

## 2022-09-23 DIAGNOSIS — Z91018 Allergy to other foods: Secondary | ICD-10-CM

## 2022-09-23 DIAGNOSIS — H93A9 Pulsatile tinnitus, unspecified ear: Secondary | ICD-10-CM

## 2022-09-23 DIAGNOSIS — Z114 Encounter for screening for human immunodeficiency virus [HIV]: Secondary | ICD-10-CM

## 2022-09-23 MED ORDER — HYDROCHLOROTHIAZIDE 12.5 MG PO CAPS: 12.5000 mg | ORAL_CAPSULE | Freq: Every day | ORAL | 0 refills | Status: AC

## 2022-09-23 MED ORDER — WEGOVY 0.25 MG/0.5ML ~~LOC~~ SOAJ: 0.2500 mg | SUBCUTANEOUS | 1 refills | Status: DC

## 2022-09-23 NOTE — Progress Notes (Signed)
SUBJECTIVE:   CHIEF COMPLAINT / HPI:   Kathy [name] is a 27 y/o female presenting to discuss weight loss, ear complaint, allergy referral and health maintenance.   Weight Loss: Patient reports she started Weight Watchers in January or February of 2024. She endorses a 23 pounds weight loss due to her own efforts. She reports feeling "stuck" and seeking additional help. Patient reports being previously prescribed what she thinks was metformin for weight loss; however, she reports adverse reactions of nausea and increased BP. She also reports being on the wait list for the [facility].  She believes her new insurance will cover weight loss medication, and she is agreeable to starting a GLP-1 today.  Ear Complaint: Patient reports about 2 weeks ago feeling pressure in her L ear after swimming. She endorses pain in her L ear about 2 days after swimming that lasted for 1 day. Since this episode, she endorses feeling like there is a blowing sound in her ear, which seems to coincide with her heartbeat. Patient denies any ear drainage or congestion.   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 Recently, she describes eating pasta sauce that accidentally had mushrooms in it and not experiencing any symptoms. Patient requests referral for allergy testing to confirm mushroom allergy.  HTN: Patient reports BP reading of 166/80 this AM after drinking an energy drink. She reports average BP of 130-140 systolic at home. Patient denies using prescribed hydrochlorothiazide due to financial cost but now has insurance.  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 She provided paperwork about a health maintenance program through her work and asked if the office could fill it out.   OBJECTIVE:   BP (!) 150/97   Pulse 92   Ht 5\' 7"  (1.702 m)   Wt (!) 511 lb 9.6 oz (232.1 kg)   LMP 09/19/2022   SpO2 98%   BMI 80.13 kg/m     General: NAD, cooperative, pleasant HEENT: Normocephalic, atraumatic, L ear canal with cerumen; no erythema, non-bulging tympanic membrane  Cardiovascular: RRR, no murmur  Respiratory: Clear to auscultation, normal work of breathing Extremities: 2+ dp equal bilaterally  ASSESSMENT/PLAN:   Hypertension HTN uncontrolled. Given patient's new insurance status, hydrochlorothiazide 12.5 mg ordered for BP control.  Follow-up in 1 month to recheck BP and labs.  Morbid obesity (HCC) Patient congratulated for recent weight loss. Patient interested in meeting with a registered dietitian while waiting to hear back from [facility]. Referral to a nutritionist placed.  Patient encouraged to continue to call to check on the [place]. Additionally, given patient feeling "stuck" with her weight loss journey, a weight loss medication may assist. Reginal Lutes will be ordered to pharmacy.     Pulsatile Tinnitus Suspect hearing changes due to water in ear from swimming; however cannot rule out AVM or other more serious etiology. Given persistent pulsatile tinnitus, referral to ENT placed.  Provided phone number for her to call if she has not heard in the next day or 2.  Allergy Testing Patient may not be allergic to mushrooms at this time but cannot confirm without allergy testing. Referral to allergy specialist placed.   Health Maintenance  - Patient is not fasting today, so encouraged patient to come back for a lab visit to obtain blood work for CMP, HCV, HIV, lipid panel and A1c.  Will complete work paperwork after labs have resulted. -  Patient is due for pap smear; patient defers pap smear to next appointment due to time constraints today  FOLLOW UP: Patient is to follow up in one month for BP check, pap smear and f/u on wegovy.   Bubba Hales, Medical Student [facility]  Patient seen along with medical student Filomena Jungling. I personally evaluated this patient along with the student, and verified all aspects of the history, physical exam, and medical decision making as documented by the student. I agree with the student's documentation and have made all necessary edits.  Levert Feinstein, MD  [facility]

## 2022-09-23 NOTE — Assessment & Plan Note (Addendum)
Patient congratulated for recent weight loss. Patient interested in meeting with a registered dietitian while waiting to hear back from [facility]. Referral to a nutritionist placed.  Patient encouraged to continue to call to check on the [place]. Additionally, given patient feeling "stuck" with her weight loss journey, a weight loss medication may assist. Reginal Lutes will be ordered to pharmacy.

## 2022-09-23 NOTE — Assessment & Plan Note (Addendum)
HTN uncontrolled. Given patient's new insurance status, hydrochlorothiazide 12.5 mg ordered for BP control.  Follow-up in 1 month to recheck BP and labs.

## 2022-09-23 NOTE — Patient Instructions (Addendum)
It was great to see you again today.  Referring to ENT doctor. Please call them to schedule [phone] [facility] [place] [address] [place], [place], [place] [address]  Schedule follow up in 3-4 weeks to see how wegovy is doing, recheck blood pressure and do pap smear.  On your way out, schedule an appointment one morning to come back for fasting labs. Do not eat or drink anything other than water the morning of your lab appointment until after your labs are drawn.   Referring to allergist and nutritionist as well  Be well, Dr. Pollie Meyer

## 2022-09-29 ENCOUNTER — Telehealth: Payer: Self-pay

## 2022-09-29 ENCOUNTER — Other Ambulatory Visit (HOSPITAL_COMMUNITY): Payer: Self-pay

## 2022-09-29 NOTE — Telephone Encounter (Signed)
Pharmacy Patient Advocate Encounter   Received notification from CoverMyMeds that prior authorization for [facility] is required/requested.   The patient is insured through  [facility]  .   Per test claim: PA submitted to [facility] via CoverMyMeds Key/confirmation #/EOC Kathy [name] Status is pending

## 2022-10-04 NOTE — Telephone Encounter (Signed)
Pharmacy Patient Advocate Encounter  Received notification from  [facility])  that Prior Authorization for [name] has been  APPROVED 10/02/22 - 04/16/23.  PA #/Case ID/Reference #: [id number]

## 2022-10-20 ENCOUNTER — Encounter: Payer: Self-pay | Admitting: Family Medicine

## 2022-10-20 NOTE — Telephone Encounter (Signed)
Patient LVM on nurse line regarding concern.   Called patient back to discuss symptoms further.   She did not answer, LVM asking that she call back to the office.   Will also send mychart message.   Veronda Prude, RN

## 2022-10-21 MED ORDER — MEDROXYPROGESTERONE ACETATE 10 MG PO TABS: 10.0000 mg | ORAL_TABLET | Freq: Every day | ORAL | 0 refills | Status: DC

## 2022-10-21 NOTE — Addendum Note (Signed)
Addended by: Pearlean Brownie L on: 10/21/2022 02:32 PM   Modules accepted: Orders

## 2022-10-25 ENCOUNTER — Ambulatory Visit: Payer: Medicaid Other | Admitting: Family Medicine

## 2022-10-29 ENCOUNTER — Other Ambulatory Visit: Payer: Self-pay | Admitting: Family Medicine

## 2022-10-29 ENCOUNTER — Telehealth: Payer: Self-pay

## 2022-10-29 NOTE — Progress Notes (Signed)
Patient attempted to be outreached by Mack Guise on 10/29/22 to discuss hypertension. Left voicemail for patient to return our call at their convenience at [phone].  Mack Guise, Student-PharmD

## 2022-11-01 ENCOUNTER — Ambulatory Visit: Payer: Medicaid Other | Admitting: Family Medicine

## 2022-11-02 ENCOUNTER — Other Ambulatory Visit: Payer: Self-pay

## 2022-11-03 MED ORDER — WEGOVY 0.25 MG/0.5ML ~~LOC~~ SOAJ: 0.2500 mg | SUBCUTANEOUS | 1 refills | Status: AC

## 2022-11-05 MED ORDER — MEDROXYPROGESTERONE ACETATE 10 MG PO TABS: 10.0000 mg | ORAL_TABLET | Freq: Every day | ORAL | 0 refills | Status: DC

## 2022-11-05 NOTE — Telephone Encounter (Signed)
Will refill, please advise patient needs to keep follow up appts, cannot just indefinitely rx provera via phone Thanks  Latrelle Dodrill, MD

## 2022-11-05 NOTE — Telephone Encounter (Signed)
Patient calls nurse line regarding request for medroxyprogesterone. She states that she took medication for 10 days, bleeding went away and then returned when she stopped the medication. She has been off of medication for approx 1 week now. She reports that bleeding is her normal but "still heavy."  She denies lightheadedness, dizziness, shortness of breath, fatigue.   Requesting refill be sent to [facility] on [address].   Please advise.   Veronda Prude, RN

## 2022-11-05 NOTE — Addendum Note (Signed)
Addended by: Latrelle Dodrill on: 11/05/2022 03:04 PM   Modules accepted: Orders

## 2022-11-09 ENCOUNTER — Ambulatory Visit: Payer: Self-pay | Admitting: Internal Medicine

## 2022-11-14 ENCOUNTER — Other Ambulatory Visit: Payer: Self-pay | Admitting: Family Medicine

## 2022-11-24 ENCOUNTER — Encounter: Payer: Self-pay | Admitting: Pharmacist

## 2022-11-25 ENCOUNTER — Other Ambulatory Visit: Payer: Self-pay | Admitting: Family Medicine

## 2022-12-02 ENCOUNTER — Other Ambulatory Visit: Payer: Self-pay | Admitting: Family Medicine

## 2022-12-02 ENCOUNTER — Encounter: Payer: Self-pay | Admitting: Family Medicine

## 2022-12-02 ENCOUNTER — Ambulatory Visit: Payer: BC Managed Care – PPO | Admitting: Family Medicine

## 2022-12-13 ENCOUNTER — Other Ambulatory Visit: Payer: Self-pay | Admitting: Family Medicine

## 2022-12-13 NOTE — Telephone Encounter (Signed)
Please let patient know I am refilling this medication, but she needs to schedule an appointment with me. This is the last time I will fill it without her scheduling an appointment.   Thanks, Latrelle Dodrill, MD

## 2022-12-14 NOTE — Telephone Encounter (Signed)
Called and informed patient about medication. Also schedule patient.   Thank you Nehemiah Settle

## 2022-12-21 ENCOUNTER — Other Ambulatory Visit: Payer: Self-pay | Admitting: Family Medicine

## 2023-01-11 DIAGNOSIS — R062 Wheezing: Secondary | ICD-10-CM | POA: Diagnosis not present

## 2023-01-11 DIAGNOSIS — R6883 Chills (without fever): Secondary | ICD-10-CM | POA: Diagnosis not present

## 2023-01-11 DIAGNOSIS — R059 Cough, unspecified: Secondary | ICD-10-CM | POA: Diagnosis not present

## 2023-01-11 DIAGNOSIS — J069 Acute upper respiratory infection, unspecified: Secondary | ICD-10-CM | POA: Diagnosis not present

## 2023-01-18 ENCOUNTER — Ambulatory Visit: Payer: BC Managed Care – PPO | Admitting: Family Medicine

## 2023-02-01 ENCOUNTER — Ambulatory Visit: Payer: BC Managed Care – PPO | Admitting: Dietician

## 2023-05-20 ENCOUNTER — Ambulatory Visit
Admission: EM | Admit: 2023-05-20 | Discharge: 2023-05-20 | Disposition: A | Discharge status: Home / Self Care | Attending: Family Medicine | Admitting: Family Medicine

## 2023-05-20 ENCOUNTER — Telehealth: Payer: Self-pay

## 2023-05-20 DIAGNOSIS — J208 Acute bronchitis due to other specified organisms: Secondary | ICD-10-CM

## 2023-05-20 DIAGNOSIS — J4530 Mild persistent asthma, uncomplicated: Secondary | ICD-10-CM

## 2023-05-20 LAB — POC COVID19/FLU A&B COMBO
Covid Antigen, POC: NEGATIVE
Influenza A Antigen, POC: NEGATIVE
Influenza B Antigen, POC: NEGATIVE

## 2023-05-20 MED ORDER — ALBUTEROL SULFATE HFA 108 (90 BASE) MCG/ACT IN AERS: 4.0000 | INHALATION_SPRAY | RESPIRATORY_TRACT | 0 refills | Status: AC | PRN

## 2023-05-20 MED ORDER — PREDNISONE 20 MG PO TABS: ORAL_TABLET | ORAL | 0 refills | Status: DC

## 2023-05-20 MED ORDER — BENZONATATE 100 MG PO CAPS: 100.0000 mg | ORAL_CAPSULE | Freq: Three times a day (TID) | ORAL | 0 refills | Status: AC | PRN

## 2023-05-20 MED ORDER — PROMETHAZINE-DM 6.25-15 MG/5ML PO SYRP: 5.0000 mL | ORAL_SOLUTION | Freq: Three times a day (TID) | ORAL | 0 refills | Status: DC | PRN

## 2023-05-20 NOTE — ED Triage Notes (Signed)
Pt reports shortness of breath x4 days.

## 2023-05-20 NOTE — Telephone Encounter (Signed)
 Patient calls nurse line regarding concerns with shortness of breath. She has been experiencing shortness of breath since Monday. Feeling like she is getting short of breath with speaking and exertion.   She has also had a dry cough.   Denies fever, chills or body aches.   She is resting now and is not currently experiencing shortness of breath. Speaking in complete sentences.   Due to weekend and lack of availability today, recommended that patient be evaluated in urgent care.   Patient reports that she will proceed to urgent care for further evaluation.   Veronda Prude, RN

## 2023-05-20 NOTE — ED Provider Notes (Signed)
 [facility]RG[facility]  Note:  This document was prepared using Conservation officer, historic buildings and may include unintentional dictation errors.  MRN: [id number] DOB: 12/08/1995  Subjective:   Kathy [name] is a 28 y.o. female presenting for 4-day history of dry coughing, shortness of breath and chest tightness.  No fever, chest pain.  No history of clotting disorders.  No hospitalizations, trauma, recent long distance travel.  No throat pain.  No current facility-administered medications for this encounter.  Current Outpatient Medications:    albuterol (VENTOLIN HFA) 108 (90 Base) MCG/ACT inhaler, Inhale 4 puffs into the lungs every 4 (four) hours as needed for wheezing or shortness of breath., Disp: 18 g, Rfl: 0   benzonatate (TESSALON) 100 MG capsule, Take 1 capsule (100 mg total) by mouth 3 (three) times daily as needed for cough., Disp: 30 capsule, Rfl: 0   EPINEPHrine 0.3 mg/0.3 mL IJ SOAJ injection, Inject 0.3 mLs (0.3 mg total) into the muscle as needed for anaphylaxis., Disp: 1 each, Rfl: 1   hydrochlorothiazide (MICROZIDE) 12.5 MG capsule, Take 1 capsule (12.5 mg total) by mouth daily., Disp: 90 capsule, Rfl: 0   ibuprofen (ADVIL) 800 MG tablet, Take 1 tablet (800 mg total) by mouth 3 (three) times daily. (Patient taking differently: Take 800 mg by mouth 3 (three) times daily as needed.), Disp: 30 tablet, Rfl: 0   medroxyPROGESTERone (PROVERA) 10 MG tablet, TAKE 1 TABLET(10 MG) BY MOUTH DAILY, Disp: 20 tablet, Rfl: 0   Semaglutide-Weight Management (WEGOVY) 0.25 MG/0.5ML SOAJ, Inject 0.25 mg into the skin once a week., Disp: 2 mL, Rfl: 1   Allergies  Allergen Reactions   Mushroom Extract Complex (Obsolete)     Throat itching    Past Medical History:  Diagnosis Date   Obesity    Strep throat      Past Surgical History:  Procedure Laterality Date   TONSILLECTOMY  2007    Family History  Problem Relation Age of Onset   Breast cancer Mother 46    Diabetes Father    Diabetes Paternal Grandmother    Diabetes Paternal Grandfather    Cervical cancer Maternal Grandmother    Cervical cancer Maternal Aunt     Social History   Tobacco Use   Smoking status: Never   Smokeless tobacco: Never  Vaping Use   Vaping status: Never Used  Substance Use Topics   Alcohol use: No   Drug use: No    ROS   Objective:   Vitals: BP (!) 149/84 (BP Location: Right Wrist)   Pulse (!) 110   Temp 98.2 F (36.8 C) (Oral)   Resp 20   LMP 05/07/2023 (Approximate)   SpO2 94%   Physical Exam Constitutional:      General: She is not in acute distress.    Appearance: Normal appearance. She is well-developed. She is obese. She is not ill-appearing, toxic-appearing or diaphoretic.  HENT:     Head: Normocephalic and atraumatic.     Nose: Nose normal.     Mouth/Throat:     Mouth: Mucous membranes are moist.     Pharynx: No pharyngeal swelling, oropharyngeal exudate, posterior oropharyngeal erythema or uvula swelling.     Tonsils: No tonsillar exudate or tonsillar abscesses. 0 on the right. 0 on the left.  Eyes:     General: No scleral icterus.       Right eye: No discharge.        Left eye: No discharge.  Extraocular Movements: Extraocular movements intact.  Cardiovascular:     Rate and Rhythm: Normal rate and regular rhythm.     Heart sounds: Normal heart sounds. No murmur heard.    No friction rub. No gallop.  Pulmonary:     Effort: Pulmonary effort is normal. No respiratory distress.     Breath sounds: No stridor. No wheezing, rhonchi or rales.  Chest:     Chest wall: No tenderness.  Skin:    General: Skin is warm and dry.  Neurological:     General: No focal deficit present.     Mental Status: She is alert and oriented to person, place, and time.  Psychiatric:        Mood and Affect: Mood normal.        Behavior: Behavior normal.     Results for orders placed or performed during the hospital encounter of 05/20/23 (from the  past 24 hours)  POC Covid19/Flu A&B Antigen     Status: Normal   Collection Time: 05/20/23  2:31 PM  Result Value Ref Range   Influenza A Antigen, POC Negative    Influenza B Antigen, POC Negative    Covid Antigen, POC Negative      Assessment and Plan :   PDMP not reviewed this encounter.  1. Mild persistent asthma, uncomplicated   2. Viral bronchitis    Will cover for for asthmatic bronchitis, viral bronchitis.  Recommend prednisone.  Patient needs a refill of her albuterol and this was provided.  Use supportive care otherwise.  Counseled patient on potential for adverse effects with medications prescribed/recommended today, ER and return-to-clinic precautions discussed, patient verbalized understanding.    Wallis Bamberg, [place] 05/20/23 1456

## 2023-06-27 ENCOUNTER — Telehealth: Admitting: Nurse Practitioner

## 2023-06-27 DIAGNOSIS — R051 Acute cough: Secondary | ICD-10-CM

## 2023-06-27 DIAGNOSIS — J0110 Acute frontal sinusitis, unspecified: Secondary | ICD-10-CM

## 2023-06-27 MED ORDER — AMOXICILLIN-POT CLAVULANATE 875-125 MG PO TABS: 1.0000 | ORAL_TABLET | Freq: Two times a day (BID) | ORAL | 0 refills | Status: AC

## 2023-06-27 MED ORDER — BENZONATATE 100 MG PO CAPS: 100.0000 mg | ORAL_CAPSULE | Freq: Three times a day (TID) | ORAL | 0 refills | Status: AC | PRN

## 2023-06-27 MED ORDER — FLUTICASONE PROPIONATE 50 MCG/ACT NA SUSP: 2.0000 | Freq: Every day | NASAL | 6 refills | Status: AC

## 2023-06-27 NOTE — Progress Notes (Signed)
 Virtual Visit Consent   WOODROW DULSKI, you are scheduled for a virtual visit with a [facility] provider today. Just as with appointments in the office, your consent must be obtained to participate. Your consent will be active for this visit and any virtual visit you may have with one of our providers in the next 365 days. If you have a MyChart account, a copy of this consent can be sent to you electronically.  As this is a virtual visit, video technology does not allow for your provider to perform a traditional examination. This may limit your provider's ability to fully assess your condition. If your provider identifies any concerns that need to be evaluated in person or the need to arrange testing (such as labs, EKG, etc.), we will make arrangements to do so. Although advances in technology are sophisticated, we cannot ensure that it will always work on either your end or our end. If the connection with a video visit is poor, the visit may have to be switched to a telephone visit. With either a video or telephone visit, we are not always able to ensure that we have a secure connection.  By engaging in this virtual visit, you consent to the provision of healthcare and authorize for your insurance to be billed (if applicable) for the services provided during this visit. Depending on your insurance coverage, you may receive a charge related to this service.  I need to obtain your verbal consent now. Are you willing to proceed with your visit today? LORRA FREEMAN has provided verbal consent on 06/27/2023 for a virtual visit (video or telephone). Mardene Shake, FNP  Date: 06/27/2023 5:01 PM   Virtual Visit via Video Note   I, Mardene Shake, connected with  CHARLETT MERKLE  ([id number], June 30, 1995) on 06/27/23 at  5:00 PM EDT by a video-enabled telemedicine application and verified that I am speaking with the correct person using two identifiers.  Location: Patient: Virtual Visit Location Patient:  Home Provider: Virtual Visit Location Provider: Home Office   I discussed the limitations of evaluation and management by telemedicine and the availability of in person appointments. The patient expressed understanding and agreed to proceed.    History of Present Illness: ARDENA GANGL is a 28 y.o. who identifies as a female who was assigned female at birth, and is being seen today for a dry cough and headaches   She has had a virus / head cold for the past 5-6  Symptoms include headache, cough, sinus congestion  Headaches and dry cough have persisted   Her headache is located in right frontal region and worsens when she leans onto that side   She denies any fever    Problems:  Patient Active Problem List   Diagnosis Date Noted   Hypertension 07/20/2021   Polymenorrhea 12/02/2018   Family history of breast cancer 03/31/2016   Menstrual cramps 03/31/2016   Binge eating 07/23/2015   Depression 07/23/2015   Eczema 06/22/2012   ELEVATED BLOOD PRESSURE 04/06/2007   Morbid obesity (HCC) 12/28/2006    Allergies:  Allergies  Allergen Reactions   Mushroom Extract Complex (Obsolete)     Throat itching   Medications:  Current Outpatient Medications:    albuterol  (VENTOLIN  HFA) 108 (90 Base) MCG/ACT inhaler, Inhale 4 puffs into the lungs every 4 (four) hours as needed for wheezing or shortness of breath., Disp: 18 g, Rfl: 0   benzonatate  (TESSALON ) 100 MG capsule, Take 1 capsule (100 mg total) by  mouth 3 (three) times daily as needed for cough., Disp: 30 capsule, Rfl: 0   EPINEPHrine  0.3 mg/0.3 mL IJ SOAJ injection, Inject 0.3 mLs (0.3 mg total) into the muscle as needed for anaphylaxis., Disp: 1 each, Rfl: 1   hydrochlorothiazide  (MICROZIDE ) 12.5 MG capsule, Take 1 capsule (12.5 mg total) by mouth daily., Disp: 90 capsule, Rfl: 0   ibuprofen  (ADVIL ) 800 MG tablet, Take 1 tablet (800 mg total) by mouth 3 (three) times daily. (Patient taking differently: Take 800 mg by mouth 3 (three)  times daily as needed.), Disp: 30 tablet, Rfl: 0   medroxyPROGESTERone  (PROVERA ) 10 MG tablet, TAKE 1 TABLET(10 MG) BY MOUTH DAILY, Disp: 20 tablet, Rfl: 0   predniSONE  (DELTASONE ) 20 MG tablet, Take 2 tablets daily with breakfast., Disp: 10 tablet, Rfl: 0   promethazine -dextromethorphan (PROMETHAZINE -DM) 6.25-15 MG/5ML syrup, Take 5 mLs by mouth 3 (three) times daily as needed for cough., Disp: 200 mL, Rfl: 0   Semaglutide -Weight Management (WEGOVY ) 0.25 MG/0.5ML SOAJ, Inject 0.25 mg into the skin once a week., Disp: 2 mL, Rfl: 1  Observations/Objective: Patient is well-developed, well-nourished in no acute distress.  Resting comfortably  at home.  Head is normocephalic, atraumatic.  No labored breathing.  Speech is clear and coherent with logical content.  Patient is alert and oriented at baseline.    Assessment and Plan:  1. Acute non-recurrent frontal sinusitis Ibuprofen  for added relief from sinus headache   2. Acute cough   Meds ordered this encounter  Medications   fluticasone  (FLONASE ) 50 MCG/ACT nasal spray    Sig: Place 2 sprays into both nostrils daily.    Dispense:  16 g    Refill:  6   benzonatate  (TESSALON ) 100 MG capsule    Sig: Take 1 capsule (100 mg total) by mouth 3 (three) times daily as needed.    Dispense:  30 capsule    Refill:  0   amoxicillin -clavulanate (AUGMENTIN ) 875-125 MG tablet    Sig: Take 1 tablet by mouth 2 (two) times daily for 7 days. Take with food    Dispense:  14 tablet    Refill:  0     Follow Up Instructions: I discussed the assessment and treatment plan with the patient. The patient was provided an opportunity to ask questions and all were answered. The patient agreed with the plan and demonstrated an understanding of the instructions.  A copy of instructions were sent to the patient via MyChart unless otherwise noted below.    The patient was advised to call back or seek an in-person evaluation if the symptoms worsen or if the  condition fails to improve as anticipated.    Mardene Shake, FNP

## 2023-07-29 ENCOUNTER — Telehealth

## 2023-07-29 ENCOUNTER — Telehealth: Admitting: Physician Assistant

## 2023-07-29 ENCOUNTER — Telehealth: Admitting: Family Medicine

## 2023-07-29 DIAGNOSIS — R197 Diarrhea, unspecified: Secondary | ICD-10-CM

## 2023-07-29 DIAGNOSIS — R112 Nausea with vomiting, unspecified: Secondary | ICD-10-CM

## 2023-07-29 MED ORDER — ONDANSETRON 4 MG PO TBDP: 4.0000 mg | ORAL_TABLET | Freq: Three times a day (TID) | ORAL | 0 refills | Status: DC | PRN

## 2023-07-29 NOTE — Progress Notes (Signed)
 Virtual Visit Consent   Kathy [name], you are scheduled for a virtual visit with a [facility] provider today. Just as with appointments in the office, your consent must be obtained to participate. Your consent will be active for this visit and any virtual visit you may have with one of our providers in the next 365 days. If you have a MyChart account, a copy of this consent can be sent to you electronically.  As this is a virtual visit, video technology does not allow for your provider to perform a traditional examination. This may limit your provider's ability to fully assess your condition. If your provider identifies any concerns that need to be evaluated in person or the need to arrange testing (such as labs, EKG, etc.), we will make arrangements to do so. Although advances in technology are sophisticated, we cannot ensure that it will always work on either your end or our end. If the connection with a video visit is poor, the visit may have to be switched to a telephone visit. With either a video or telephone visit, we are not always able to ensure that we have a secure connection.  By engaging in this virtual visit, you consent to the provision of healthcare and authorize for your insurance to be billed (if applicable) for the services provided during this visit. Depending on your insurance coverage, you may receive a charge related to this service.  I need to obtain your verbal consent now. Are you willing to proceed with your visit today? Kathy [name] has provided verbal consent on 07/29/2023 for a virtual visit (video or telephone). Kathy [name], [place]  Date: 07/29/2023 2:32 PM   Virtual Visit via Video Note   I, Kathy [name], connected with  Kathy [name]  ([id number], [birth year]) on 07/29/23 at  2:30 PM EDT by a video-enabled telemedicine application and verified that I am speaking with the correct person using two identifiers.  Location: Patient: Virtual Visit Location Patient:  Home Provider: Virtual Visit Location Provider: Home Office   I discussed the limitations of evaluation and management by telemedicine and the availability of in person appointments. The patient expressed understanding and agreed to proceed.    History of Present Illness: Kathy [name] is a 28 y.o. who identifies as a female who was assigned female at birth, and is being seen today for diarrhea and nausea. Previously seen via evisit. Kathy [name]  HPI: Diarrhea  This is a new problem.    Problems:  Patient Active Problem List   Diagnosis Date Noted   Hypertension 07/20/2021   Polymenorrhea 12/02/2018   Family history of breast cancer 03/31/2016   Menstrual cramps 03/31/2016   Binge eating 07/23/2015   Depression 07/23/2015   Eczema 06/22/2012   ELEVATED BLOOD PRESSURE 04/06/2007   Morbid obesity (HCC) 12/28/2006    Allergies:  Allergies  Allergen Reactions   Mushroom Extract Complex (Obsolete)     Throat itching   Medications:  Current Outpatient Medications:    albuterol  (VENTOLIN  HFA) 108 (90 Base) MCG/ACT inhaler, Inhale 4 puffs into the lungs every 4 (four) hours as needed for wheezing or shortness of breath., Disp: 18 g, Rfl: 0   benzonatate  (TESSALON ) 100 MG capsule, Take 1 capsule (100 mg total) by mouth 3 (three) times daily as needed for cough., Disp: 30 capsule, Rfl: 0   benzonatate  (TESSALON ) 100 MG capsule, Take 1 capsule (100 mg total) by mouth 3 (three) times daily as needed., Disp: 30 capsule, Rfl: 0  EPINEPHrine  0.3 mg/0.3 mL IJ SOAJ injection, Inject 0.3 mLs (0.3 mg total) into the muscle as needed for anaphylaxis., Disp: 1 each, Rfl: 1   fluticasone  (FLONASE ) 50 MCG/ACT nasal spray, Place 2 sprays into both nostrils daily., Disp: 16 g, Rfl: 6   hydrochlorothiazide  (MICROZIDE ) 12.5 MG capsule, Take 1 capsule (12.5 mg total) by mouth daily., Disp: 90 capsule, Rfl: 0   ibuprofen  (ADVIL ) 800 MG tablet, Take 1 tablet (800 mg total) by mouth 3 (three) times daily. (Patient  taking differently: Take 800 mg by mouth 3 (three) times daily as needed.), Disp: 30 tablet, Rfl: 0   medroxyPROGESTERone  (PROVERA ) 10 MG tablet, TAKE 1 TABLET(10 MG) BY MOUTH DAILY, Disp: 20 tablet, Rfl: 0   ondansetron  (ZOFRAN -ODT) 4 MG disintegrating tablet, Take 1 tablet (4 mg total) by mouth every 8 (eight) hours as needed., Disp: 20 tablet, Rfl: 0   predniSONE  (DELTASONE ) 20 MG tablet, Take 2 tablets daily with breakfast., Disp: 10 tablet, Rfl: 0   promethazine -dextromethorphan (PROMETHAZINE -DM) 6.25-15 MG/5ML syrup, Take 5 mLs by mouth 3 (three) times daily as needed for cough., Disp: 200 mL, Rfl: 0   Semaglutide -Weight Management (WEGOVY ) 0.25 MG/0.5ML SOAJ, Inject 0.25 mg into the skin once a week., Disp: 2 mL, Rfl: 1  Observations/Objective: Patient is well-developed, well-nourished in no acute distress.  Resting comfortably  at home.  Head is normocephalic, atraumatic.  No labored breathing.  Speech is clear and coherent with logical content.  Patient is alert and oriented at baseline.    Assessment and Plan: 1. Nausea and vomiting, unspecified vomiting type (Primary)  2. Diarrhea, unspecified type  Patient seen via e-visit earlier [facility] presenting with the same symptoms of nausea vomiting and diarrhea.  States she however has not actually vomited she has just had nausea.  She has not gone to pick up her prescription that was provided by the previous provider however is presenting this time for a work note.  I provided the work note and counseled the patient that she should attempt a bland diet and remain hydrated as much as possible.  I advised her that if after taking the Zofran  or her symptoms are not better in the next 24 to 48 hours that she should present to the nearest urgent care or emergency department for evaluation.  She verbalized understanding and was in agreement with this plan all questions were answered at the time during her visit.  Follow Up Instructions: I  discussed the assessment and treatment plan with the patient. The patient was provided an opportunity to ask questions and all were answered. The patient agreed with the plan and demonstrated an understanding of the instructions.  A copy of instructions were sent to the patient via MyChart unless otherwise noted below.    The patient was advised to call back or seek an in-person evaluation if the symptoms worsen or if the condition fails to improve as anticipated.    Kathy Settle, [name]

## 2023-07-29 NOTE — Progress Notes (Signed)
 E-Visit for Diarrhea  We are sorry that you are not feeling well.  Here is how we plan to help!  Based on what you have shared with me it looks like you have Acute Infectious Diarrhea.  Most cases of acute diarrhea are due to infections with virus and bacteria and are self-limited conditions lasting less than 14 days.  For your symptoms you may take Imodium 2 mg tablets that are over the counter at your local pharmacy. Take two tablet now and then one after each loose stool up to 6 a day.  Antibiotics are not needed for most people with diarrhea.  Optional: Zofran  4 mg 1 tablet every 8 hours as needed for nausea and vomiting  Black stools can be a common side effect of Pepto Bismol. You can stop that and your stool should return to a normal color within 24-48 hours. If not, you should then seek in-person evaluation.   HOME CARE We recommend changing your diet to help with your symptoms for the next few days. Drink plenty of fluids that contain water salt and sugar. Sports drinks such as Gatorade may help.  You may try broths, soups, bananas, applesauce, soft breads, mashed potatoes or crackers.  You are considered infectious for as long as the diarrhea continues. Hand washing or use of alcohol based hand sanitizers is recommend. It is best to stay out of work or school until your symptoms stop.   GET HELP RIGHT AWAY If you have dark yellow colored urine or do not pass urine frequently you should drink more fluids.   If your symptoms worsen  If you feel like you are going to pass out (faint) You have a new problem  MAKE SURE YOU  Understand these instructions. Will watch your condition. Will get help right away if you are not doing well or get worse.  Thank you for choosing an e-visit.  Your e-visit answers were reviewed by a board certified advanced clinical practitioner to complete your personal care plan. Depending upon the condition, your plan could have included both over the  counter or prescription medications.  Please review your pharmacy choice. Make sure the pharmacy is open so you can pick up prescription now. If there is a problem, you may contact your provider through [employer] and have the prescription routed to another pharmacy.  Your safety is important to us . If you have drug allergies check your prescription carefully.   For the next 24 hours you can use MyChart to ask questions about today's visit, request a non-urgent call back, or ask for a work or school excuse. You will get an email in the next two days asking about your experience. I hope that your e-visit has been valuable and will speed your recovery.   I have spent 5 minutes in review of e-visit questionnaire, review and updating patient chart, medical decision making and response to patient.   Angelia Kelp, [name]

## 2023-07-29 NOTE — Patient Instructions (Signed)
 Adella Honey, thank you for joining Marciana Settle, [name] for today's virtual visit.  While this provider is not your primary care provider (PCP), if your PCP is located in our provider database this encounter information will be shared with them immediately following your visit.   A [facility] MyChart account gives you access to today's visit and all your visits, tests, and labs performed at [facility] " click here if you don't have a [facility] MyChart account or go to [email or web address]  Consent: (Patient) Adella Honey provided verbal consent for this virtual visit at the beginning of the encounter.  Current Medications:  Current Outpatient Medications:    albuterol  (VENTOLIN  HFA) 108 (90 Base) MCG/ACT inhaler, Inhale 4 puffs into the lungs every 4 (four) hours as needed for wheezing or shortness of breath., Disp: 18 g, Rfl: 0   benzonatate  (TESSALON ) 100 MG capsule, Take 1 capsule (100 mg total) by mouth 3 (three) times daily as needed for cough., Disp: 30 capsule, Rfl: 0   benzonatate  (TESSALON ) 100 MG capsule, Take 1 capsule (100 mg total) by mouth 3 (three) times daily as needed., Disp: 30 capsule, Rfl: 0   EPINEPHrine  0.3 mg/0.3 mL IJ SOAJ injection, Inject 0.3 mLs (0.3 mg total) into the muscle as needed for anaphylaxis., Disp: 1 each, Rfl: 1   fluticasone  (FLONASE ) 50 MCG/ACT nasal spray, Place 2 sprays into both nostrils daily., Disp: 16 g, Rfl: 6   hydrochlorothiazide  (MICROZIDE ) 12.5 MG capsule, Take 1 capsule (12.5 mg total) by mouth daily., Disp: 90 capsule, Rfl: 0   ibuprofen  (ADVIL ) 800 MG tablet, Take 1 tablet (800 mg total) by mouth 3 (three) times daily. (Patient taking differently: Take 800 mg by mouth 3 (three) times daily as needed.), Disp: 30 tablet, Rfl: 0   medroxyPROGESTERone  (PROVERA ) 10 MG tablet, TAKE 1 TABLET(10 MG) BY MOUTH DAILY, Disp: 20 tablet, Rfl: 0   ondansetron  (ZOFRAN -ODT) 4 MG disintegrating tablet, Take 1 tablet (4 mg total) by  mouth every 8 (eight) hours as needed., Disp: 20 tablet, Rfl: 0   predniSONE  (DELTASONE ) 20 MG tablet, Take 2 tablets daily with breakfast., Disp: 10 tablet, Rfl: 0   promethazine -dextromethorphan (PROMETHAZINE -DM) 6.25-15 MG/5ML syrup, Take 5 mLs by mouth 3 (three) times daily as needed for cough., Disp: 200 mL, Rfl: 0   Semaglutide -Weight Management (WEGOVY ) 0.25 MG/0.5ML SOAJ, Inject 0.25 mg into the skin once a week., Disp: 2 mL, Rfl: 1   Medications ordered in this encounter:  No orders of the defined types were placed in this encounter.    *If you need refills on other medications prior to your next appointment, please contact your pharmacy*  Follow-Up: Call back or seek an in-person evaluation if the symptoms worsen or if the condition fails to improve as anticipated.  [facility] [phone]  Other Instructions Please report to the nearest Emergency room with any worsening symptoms. Follow up with primary care provider (PCP) in 2 -3 days.    If you have been instructed to have an in-person evaluation today at a local Urgent Care facility, please use the link below. It will take you to a list of all of our available [facility], including address, phone number and hours of operation. Please do not delay care.  [facility]  If you or a family member do not have a primary care provider, use the link below to schedule a visit and establish care. When you choose a [facility]  primary care physician or advanced practice provider, you gain a long-term partner in health. Find a Primary Care Provider  Learn more about [facility]'s in-office and virtual care options: [facility] - Get Care Now

## 2023-09-05 ENCOUNTER — Telehealth: Admitting: Physician Assistant

## 2023-09-05 DIAGNOSIS — G43009 Migraine without aura, not intractable, without status migrainosus: Secondary | ICD-10-CM

## 2023-09-05 MED ORDER — SUMATRIPTAN SUCCINATE 25 MG PO TABS: 25.0000 mg | ORAL_TABLET | ORAL | 0 refills | Status: AC | PRN

## 2023-09-05 MED ORDER — ONDANSETRON 4 MG PO TBDP: 4.0000 mg | ORAL_TABLET | Freq: Three times a day (TID) | ORAL | 0 refills | Status: AC | PRN

## 2023-09-05 NOTE — Progress Notes (Signed)
 Virtual Visit Consent   Kathy [name], you are scheduled for a virtual visit with a [facility] provider today. Just as with appointments in the office, your consent must be obtained to participate. Your consent will be active for this visit and any virtual visit you may have with one of our providers in the next 365 days. If you have a MyChart account, a copy of this consent can be sent to you electronically.  As this is a virtual visit, video technology does not allow for your provider to perform a traditional examination. This may limit your provider's ability to fully assess your condition. If your provider identifies any concerns that need to be evaluated in person or the need to arrange testing (such as labs, EKG, etc.), we will make arrangements to do so. Although advances in technology are sophisticated, we cannot ensure that it will always work on either your end or our end. If the connection with a video visit is poor, the visit may have to be switched to a telephone visit. With either a video or telephone visit, we are not always able to ensure that we have a secure connection.  By engaging in this virtual visit, you consent to the provision of healthcare and authorize for your insurance to be billed (if applicable) for the services provided during this visit. Depending on your insurance coverage, you may receive a charge related to this service.  I need to obtain your verbal consent now. Are you willing to proceed with your visit today? JAALA BOHLE has provided verbal consent on 09/05/2023 for a virtual visit (video or telephone). Delon [name], [name]  Date: 09/05/2023 2:41 PM   Virtual Visit via Video Note   I, Delon [name], connected with  Kathy [name]  ([id number], 10-28-1995) on 09/05/23 at  2:30 PM EDT by a video-enabled telemedicine application and verified that I am speaking with the correct person using two identifiers.  Location: Patient: Virtual Visit  Location Patient: Home Provider: Virtual Visit Location Provider: Home Office   I discussed the limitations of evaluation and management by telemedicine and the availability of in person appointments. The patient expressed understanding and agreed to proceed.    History of Present Illness: [name] is a 29 y.o. who identifies as a female who was assigned female at birth, and is being seen today for migraine and nausea.  HPI: Migraine  This is a new problem. The current episode started today. The problem occurs constantly. The problem has been unchanged. The pain is located in the Left unilateral region. The pain quality is similar to prior headaches. The quality of the pain is described as aching (pressure). The pain is mild. Associated symptoms include blurred vision, nausea, phonophobia, photophobia and a visual change. Pertinent negatives include no back pain, coughing, dizziness, ear pain, eye pain, eye redness, fever, hearing loss, loss of balance, neck pain, numbness, scalp tenderness, sinus pressure, sore throat, tingling, tinnitus, vomiting or weakness. The symptoms are aggravated by bright light, weather changes and noise. She has tried nothing (has used execedrin in the past with relief but have not tried yet) for the symptoms. The treatment provided no relief. Her past medical history is significant for migraine headaches.    BP: 120/67 Glucose: 107 T: 99  Problems:  Patient Active Problem List   Diagnosis Date Noted   Hypertension 07/20/2021   Polymenorrhea 12/02/2018   Family history of breast cancer 03/31/2016   Menstrual cramps 03/31/2016  Binge eating 07/23/2015   Depression 07/23/2015   Eczema 06/22/2012   ELEVATED BLOOD PRESSURE 04/06/2007   Morbid obesity (HCC) 12/28/2006    Allergies:  Allergies  Allergen Reactions   Mushroom Extract Complex (Obsolete)     Throat itching   Medications:  Current Outpatient Medications:    ondansetron  (ZOFRAN -ODT) 4 MG  disintegrating tablet, Take 1 tablet (4 mg total) by mouth every 8 (eight) hours as needed., Disp: 20 tablet, Rfl: 0   SUMAtriptan (IMITREX) 25 MG tablet, Take 1 tablet (25 mg total) by mouth every 2 (two) hours as needed for migraine. May repeat in 2 hours if headache persists or recurs., Disp: 10 tablet, Rfl: 0   albuterol  (VENTOLIN  HFA) 108 (90 Base) MCG/ACT inhaler, Inhale 4 puffs into the lungs every 4 (four) hours as needed for wheezing or shortness of breath., Disp: 18 g, Rfl: 0   benzonatate  (TESSALON ) 100 MG capsule, Take 1 capsule (100 mg total) by mouth 3 (three) times daily as needed for cough., Disp: 30 capsule, Rfl: 0   benzonatate  (TESSALON ) 100 MG capsule, Take 1 capsule (100 mg total) by mouth 3 (three) times daily as needed., Disp: 30 capsule, Rfl: 0   EPINEPHrine  0.3 mg/0.3 mL IJ SOAJ injection, Inject 0.3 mLs (0.3 mg total) into the muscle as needed for anaphylaxis., Disp: 1 each, Rfl: 1   fluticasone  (FLONASE ) 50 MCG/ACT nasal spray, Place 2 sprays into both nostrils daily., Disp: 16 g, Rfl: 6   hydrochlorothiazide  (MICROZIDE ) 12.5 MG capsule, Take 1 capsule (12.5 mg total) by mouth daily., Disp: 90 capsule, Rfl: 0   ibuprofen  (ADVIL ) 800 MG tablet, Take 1 tablet (800 mg total) by mouth 3 (three) times daily. (Patient taking differently: Take 800 mg by mouth 3 (three) times daily as needed.), Disp: 30 tablet, Rfl: 0   medroxyPROGESTERone  (PROVERA ) 10 MG tablet, TAKE 1 TABLET(10 MG) BY MOUTH DAILY, Disp: 20 tablet, Rfl: 0   predniSONE  (DELTASONE ) 20 MG tablet, Take 2 tablets daily with breakfast., Disp: 10 tablet, Rfl: 0   promethazine -dextromethorphan (PROMETHAZINE -DM) 6.25-15 MG/5ML syrup, Take 5 mLs by mouth 3 (three) times daily as needed for cough., Disp: 200 mL, Rfl: 0   Semaglutide -Weight Management (WEGOVY ) 0.25 MG/0.5ML SOAJ, Inject 0.25 mg into the skin once a week., Disp: 2 mL, Rfl: 1  Observations/Objective: Patient is well-developed, well-nourished in no acute  distress.  Resting comfortably at home.  Head is normocephalic, atraumatic.  No labored breathing.  Speech is clear and coherent with logical content.  Patient is alert and oriented at baseline.    Assessment and Plan: 1. Migraine without aura and without status migrainosus, not intractable (Primary) - SUMAtriptan (IMITREX) 25 MG tablet; Take 1 tablet (25 mg total) by mouth every 2 (two) hours as needed for migraine. May repeat in 2 hours if headache persists or recurs.  Dispense: 10 tablet; Refill: 0 - ondansetron  (ZOFRAN -ODT) 4 MG disintegrating tablet; Take 1 tablet (4 mg total) by mouth every 8 (eight) hours as needed.  Dispense: 20 tablet; Refill: 0  - Worsening symptom of headache, blurred vision and nausea - Does mention having increasing frequency of headaches and migraines - Will Provide Imitrex to see if this helps break migraine, no more than 4 in a 24 hour period - Discussed possibly following up with PCP for preventative therapies - Zofran  added for nausea symptom - Push fluids - Rest - Strict ER precautions if headache worsens, develops intractable vomiting, double vision, severe dizziness with inability to stand or walk,  syncope, weakness/numbness/tingling on one side of the body, or confusion - Seek further evaluation if worsening or fails to improve  Follow Up Instructions: I discussed the assessment and treatment plan with the patient. The patient was provided an opportunity to ask questions and all were answered. The patient agreed with the plan and demonstrated an understanding of the instructions.  A copy of instructions were sent to the patient via MyChart unless otherwise noted below.    The patient was advised to call back or seek an in-person evaluation if the symptoms worsen or if the condition fails to improve as anticipated.    Delon [name], [name]

## 2023-09-05 NOTE — Patient Instructions (Signed)
 Kathy [name], thank you for joining Delon [name], [name] for today's virtual visit.  While this provider is not your primary care provider (PCP), if your PCP is located in our provider database this encounter information will be shared with them immediately following your visit.   A [facility] [place] MyChart account gives you access to today's visit and all your visits, tests, and labs performed at [facility]  click here if you don't have a  MyChart account or go to [email or web address]  Consent: (Patient) Kathy [name] provided verbal consent for this virtual visit at the beginning of the encounter.  Current Medications:  Current Outpatient Medications:    ondansetron  (ZOFRAN -ODT) 4 MG disintegrating tablet, Take 1 tablet (4 mg total) by mouth every 8 (eight) hours as needed., Disp: 20 tablet, Rfl: 0   SUMAtriptan (IMITREX) 25 MG tablet, Take 1 tablet (25 mg total) by mouth every 2 (two) hours as needed for migraine. May repeat in 2 hours if headache persists or recurs., Disp: 10 tablet, Rfl: 0   albuterol  (VENTOLIN  HFA) 108 (90 Base) MCG/ACT inhaler, Inhale 4 puffs into the lungs every 4 (four) hours as needed for wheezing or shortness of breath., Disp: 18 g, Rfl: 0   benzonatate  (TESSALON ) 100 MG capsule, Take 1 capsule (100 mg total) by mouth 3 (three) times daily as needed for cough., Disp: 30 capsule, Rfl: 0   benzonatate  (TESSALON ) 100 MG capsule, Take 1 capsule (100 mg total) by mouth 3 (three) times daily as needed., Disp: 30 capsule, Rfl: 0   EPINEPHrine  0.3 mg/0.3 mL IJ SOAJ injection, Inject 0.3 mLs (0.3 mg total) into the muscle as needed for anaphylaxis., Disp: 1 each, Rfl: 1   fluticasone  (FLONASE ) 50 MCG/ACT nasal spray, Place 2 sprays into both nostrils daily., Disp: 16 g, Rfl: 6   hydrochlorothiazide  (MICROZIDE ) 12.5 MG capsule, Take 1 capsule (12.5 mg total) by mouth daily., Disp: 90 capsule, Rfl: 0   ibuprofen  (ADVIL ) 800 MG tablet, Take 1  tablet (800 mg total) by mouth 3 (three) times daily. (Patient taking differently: Take 800 mg by mouth 3 (three) times daily as needed.), Disp: 30 tablet, Rfl: 0   medroxyPROGESTERone  (PROVERA ) 10 MG tablet, TAKE 1 TABLET(10 MG) BY MOUTH DAILY, Disp: 20 tablet, Rfl: 0   predniSONE  (DELTASONE ) 20 MG tablet, Take 2 tablets daily with breakfast., Disp: 10 tablet, Rfl: 0   promethazine -dextromethorphan (PROMETHAZINE -DM) 6.25-15 MG/5ML syrup, Take 5 mLs by mouth 3 (three) times daily as needed for cough., Disp: 200 mL, Rfl: 0   Semaglutide -Weight Management (WEGOVY ) 0.25 MG/0.5ML SOAJ, Inject 0.25 mg into the skin once a week., Disp: 2 mL, Rfl: 1   Medications ordered in this encounter:  Meds ordered this encounter  Medications   SUMAtriptan (IMITREX) 25 MG tablet    Sig: Take 1 tablet (25 mg total) by mouth every 2 (two) hours as needed for migraine. May repeat in 2 hours if headache persists or recurs.    Dispense:  10 tablet    Refill:  0    Supervising Provider:   LAMPTEY, PHILIP O [[id number]]   ondansetron  (ZOFRAN -ODT) 4 MG disintegrating tablet    Sig: Take 1 tablet (4 mg total) by mouth every 8 (eight) hours as needed.    Dispense:  20 tablet    Refill:  0    Supervising Provider:   BLAISE ALEENE KIDD [phone]     *If you need refills on other medications prior to your next appointment, please contact  your pharmacy*  Follow-Up: Call back or seek an in-person evaluation if the symptoms worsen or if the condition fails to improve as anticipated.  [facility] [phone]  Other Instructions  Migraine Headache A migraine headache is an intense pulsing or throbbing pain on one or both sides of the head. Migraine headaches may also cause other symptoms, such as nausea, vomiting, and sensitivity to light and noise. A migraine headache can last from 4 hours to 3 days. Talk with your health care provider about what things may bring on (trigger) your migraine headaches. What  are the causes? The exact cause is not known. However, a migraine may be caused when nerves in the brain get irritated and release chemicals that cause blood vessels to become inflamed. This inflammation causes pain. Migraines may be triggered or caused by: Smoking. Medicines, such as: Nitroglycerin, which is used to treat chest pain. Birth control pills. Estrogen. Certain blood pressure medicines. Foods or drinks that contain nitrates, glutamate, aspartame, MSG, or tyramine. Certain foods or drinks, such as aged cheeses, chocolate, alcohol, or caffeine. Doing physical activity that is very hard. Other triggers may include: Menstruation. Pregnancy. Hunger. Stress. Getting too much or too little sleep. Weather changes. Tiredness (fatigue). What increases the risk? The following factors may make you more likely to have migraine headaches: Being between the ages of 78-18 years old. Being female. Having a family history of migraine headaches. Being Caucasian. Having a mental health condition, such as depression or anxiety. Being obese. What are the signs or symptoms? The main symptom of this condition is pulsing or throbbing pain. This pain may: Happen in any area of the head, such as on one or both sides. Make it hard to do daily activities. Get worse with physical activity. Get worse around bright lights, loud noises, or smells. Other symptoms may include: Nausea. Vomiting. Dizziness. Before a migraine headache starts, you may get warning signs (an aura). An aura may include: Seeing flashing lights or having blind spots. Seeing bright spots, halos, or zigzag lines. Having tunnel vision or blurred vision. Having numbness or a tingling feeling. Having trouble talking. Having muscle weakness. After a migraine ends, you may have symptoms. These may include: Feeling tired. Trouble concentrating. How is this diagnosed? A migraine headache can be diagnosed based on: Your  symptoms. A physical exam. Tests, such as: A CT scan or an MRI of the head. These tests can help rule out other causes of headaches. Taking fluid from the spine (lumbar puncture) to examine it (cerebrospinal fluid analysis, or CSF analysis). How is this treated? This condition may be treated with medicines that: Relieve pain and nausea. Prevent migraines. Treatment may also include: Acupuncture. Lifestyle changes like avoiding foods that trigger migraine headaches. Learning ways to control your body (biofeedback). Talk therapy to help you know and deal with negative thoughts (cognitive behavioral therapy). Follow these instructions at home: Medicines Take over-the-counter and prescription medicines only as told by your provider. Ask your provider if the medicine prescribed to you: Requires you to avoid driving or using machinery. Can cause constipation. You may need to take these actions to prevent or treat constipation: Drink enough fluid to keep your pee (urine) pale yellow. Take over-the-counter or prescription medicines. Eat foods that are high in fiber, such as beans, whole grains, and fresh fruits and vegetables. Limit foods that are high in fat and processed sugars, such as fried or sweet foods. Lifestyle  Do not drink alcohol. Do not use  any products that contain nicotine or tobacco. These products include cigarettes, chewing tobacco, and vaping devices, such as e-cigarettes. If you need help quitting, ask your provider. Get 7-9 hours of sleep each night, or the amount recommended by your provider. Find ways to manage stress, such as meditation, deep breathing, or yoga. Try to exercise regularly. This can help lessen how bad and how often your migraines occur. General instructions Keep a journal to find out what triggers your migraines, so you can avoid those things. For example, write down: What you eat and drink. How much sleep you get. Any change to your diet or  medicines. If you have a migraine headache: Avoid things that make your symptoms worse, such as bright lights. Lie down in a dark, quiet room. Do not drive or use machinery. Ask your provider what activities are safe for you while you have symptoms. Keep all follow-up visits. Your provider will monitor your symptoms and recommend any further treatment. Where to find more information Coalition for Headache and Migraine Patients (CHAMP): headachemigraine.org American Migraine Foundation: americanmigrainefoundation.org National Headache Foundation: headaches.org Contact a health care provider if: You have symptoms that are different or worse than your usual migraine headache symptoms. You have more than 15 days of headaches in one month. Get help right away if: Your migraine headache becomes severe or lasts more than 72 hours. You have a fever or stiff neck. You have vision loss. Your muscles feel weak or like you cannot control them. You lose your balance often or have trouble walking. You faint. You have a seizure. This information is not intended to replace advice given to you by your health care provider. Make sure you discuss any questions you have with your health care provider. Document Revised: 10/19/2021 Document Reviewed: 10/19/2021 Elsevier Patient Education  2024 Elsevier Inc.    Sumatriptan Tablets What is this medication? SUMATRIPTAN (soo ma TRIP tan) treats migraines. It works by blocking pain signals and narrowing blood vessels in the brain. It belongs to a group of medications called triptans. It is not used to prevent migraines. This medicine may be used for other purposes; ask your health care provider or pharmacist if you have questions. COMMON BRAND NAME(S): Imitrex, Migraine Pack What should I tell my care team before I take this medication? They need to know if you have any of these conditions: Circulation problems in fingers and toes Diabetes Heart  disease High blood pressure High cholesterol History of irregular heartbeat History of stroke Kidney disease Liver disease Stomach or intestine problems Tobacco use An unusual or allergic reaction to sumatriptan, other medications, foods, dyes, or preservatives Pregnant or trying to get pregnant Breastfeeding How should I use this medication? Take this medication by mouth with a glass of water. Follow the directions on the prescription label. Do not take it more often than directed. Talk to your care team about the use of this medication in children. Special care may be needed. Overdosage: If you think you have taken too much of this medicine contact a poison control center or emergency room at once. NOTE: This medicine is only for you. Do not share this medicine with others. What if I miss a dose? This does not apply. This medication is not for regular use. What may interact with this medication? Do not take this medication with any of the following: Certain medications for migraine headache, such as almotriptan, eletriptan, frovatriptan, naratriptan, rizatriptan, sumatriptan, zolmitriptan Ergot alkaloids, such as dihydroergotamine, ergonovine, ergotamine, methylergonovine MAOIs, such  as Carbex, Eldepryl, Marplan, Nardil, and Parnate This medication may also interact with the following: Certain medications for mental health conditions This list may not describe all possible interactions. Give your health care provider a list of all the medicines, herbs, non-prescription drugs, or dietary supplements you use. Also tell them if you smoke, drink alcohol, or use illegal drugs. Some items may interact with your medicine. What should I watch for while using this medication? Visit your care team for regular checks on your progress. Tell your care team if your symptoms do not start to get better or if they get worse. This medication may affect your coordination, reaction time, or judgment. Do  not drive or operate machinery until you know how this medication affects you. Sit up or stand slowly to reduce the risk of dizzy or fainting spells. Drinking alcohol with this medication can increase the risk of these side effects. Tell your care team right away if you have any change in your eyesight. If you take migraine medications for 10 or more days a month, your migraines may get worse. Keep a diary of headache days and medication use. Contact your care team if your migraine attacks occur more frequently. What side effects may I notice from receiving this medication? Side effects that you should report to your care team as soon as possible: Allergic reactions--skin rash, itching, hives, swelling of the face, lips, tongue, or throat Burning, pain, tingling, or color changes in the legs or feet Heart attack--pain or tightness in the chest, shoulders, arms, or jaw, nausea, shortness of breath, cold or clammy skin, feeling faint or lightheaded Heart rhythm changes--fast or irregular heartbeat, dizziness, feeling faint or lightheaded, chest pain, trouble breathing Increase in blood pressure Raynaud's--cool, numb, or painful fingers or toes that may change color from pale, to blue, to red Seizures Serotonin syndrome--irritability, confusion, fast or irregular heartbeat, muscle stiffness, twitching muscles, sweating, high fever, seizure, chills, vomiting, diarrhea Stroke--sudden numbness or weakness of the face, arm, or leg, trouble speaking, confusion, trouble walking, loss of balance or coordination, dizziness, severe headache, change in vision Sudden or severe stomach pain, nausea, vomiting, fever, or bloody diarrhea Vision loss Side effects that usually do not require medical attention (report to your care team if they continue or are bothersome): Dizziness General discomfort or fatigue This list may not describe all possible side effects. Call your doctor for medical advice about side  effects. You may report side effects to FDA at 1-800-FDA-1088. Where should I keep my medication? Keep out of the reach of children and pets. Store at room temperature between 2 and 30 degrees C (36 and 86 degrees F). Throw away any unused medication after the expiration date. NOTE: This sheet is a summary. It may not cover all possible information. If you have questions about this medicine, talk to your doctor, pharmacist, or health care provider.  2024 Elsevier/Gold Standard (2021-12-29 00:00:00)   If you have been instructed to have an in-person evaluation today at a local Urgent Care facility, please use the link below. It will take you to a list of all of our available [facility], including address, phone number and hours of operation. Please do not delay care.  [facility]  If you or a family member do not have a primary care provider, use the link below to schedule a visit and establish care. When you choose a [facility] primary care physician or advanced practice provider, you gain a long-term partner  in health. Find a Primary Care Provider  Learn more about [facility] in-office and virtual care options: [facility] Get Care Now

## 2023-09-23 ENCOUNTER — Telehealth: Admitting: Physician Assistant

## 2023-09-23 ENCOUNTER — Telehealth

## 2023-09-23 DIAGNOSIS — R051 Acute cough: Secondary | ICD-10-CM | POA: Diagnosis not present

## 2023-09-23 MED ORDER — PREDNISONE 10 MG (21) PO TBPK: ORAL_TABLET | ORAL | 0 refills | Status: DC

## 2023-09-23 NOTE — Progress Notes (Signed)
 E-Visit for Cough   We are sorry that you are not feeling well.  Here is how we plan to help!  Based on your presentation I believe you most likely have A cough due to a virus.  This is called viral bronchitis and is best treated by rest, plenty of fluids and control of the cough.  You may use Ibuprofen or Tylenol as directed to help your symptoms.     In addition you may use A non-prescription cough medication called Mucinex DM: take 2 tablets every 12 hours.  Prednisone 10 mg daily for 6 days (see taper instructions below)  Directions for 6 day taper: Day 1: 2 tablets before breakfast, 1 after both lunch & dinner and 2 at bedtime Day 2: 1 tab before breakfast, 1 after both lunch & dinner and 2 at bedtime Day 3: 1 tab at each meal & 1 at bedtime Day 4: 1 tab at breakfast, 1 at lunch, 1 at bedtime Day 5: 1 tab at breakfast & 1 tab at bedtime Day 6: 1 tab at breakfast  From your responses in the eVisit questionnaire you describe inflammation in the upper respiratory tract which is causing a significant cough.  This is commonly called Bronchitis and has four common causes:   Allergies Viral Infections Acid Reflux Bacterial Infection Allergies, viruses and acid reflux are treated by controlling symptoms or eliminating the cause. An example might be a cough caused by taking certain blood pressure medications. You stop the cough by changing the medication. Another example might be a cough caused by acid reflux. Controlling the reflux helps control the cough.  USE OF BRONCHODILATOR ("RESCUE") INHALERS: There is a risk from using your bronchodilator too frequently.  The risk is that over-reliance on a medication which only relaxes the muscles surrounding the breathing tubes can reduce the effectiveness of medications prescribed to reduce swelling and congestion of the tubes themselves.  Although you feel brief relief from the bronchodilator inhaler, your asthma may actually be worsening with the  tubes becoming more swollen and filled with mucus.  This can delay other crucial treatments, such as oral steroid medications. If you need to use a bronchodilator inhaler daily, several times per day, you should discuss this with your provider.  There are probably better treatments that could be used to keep your asthma under control.     HOME CARE Only take medications as instructed by your medical team. Complete the entire course of an antibiotic. Drink plenty of fluids and get plenty of rest. Avoid close contacts especially the very young and the elderly Cover your mouth if you cough or cough into your sleeve. Always remember to wash your hands A steam or ultrasonic humidifier can help congestion.   GET HELP RIGHT AWAY IF: You develop worsening fever. You become short of breath You cough up blood. Your symptoms persist after you have completed your treatment plan MAKE SURE YOU  Understand these instructions. Will watch your condition. Will get help right away if you are not doing well or get worse.    Thank you for choosing an e-visit.  Your e-visit answers were reviewed by a board certified advanced clinical practitioner to complete your personal care plan. Depending upon the condition, your plan could have included both over the counter or prescription medications.  Please review your pharmacy choice. Make sure the pharmacy is open so you can pick up prescription now. If there is a problem, you may contact your provider through [employer]  and have the prescription routed to another pharmacy.  Your safety is important to [place]. If you have drug allergies check your prescription carefully.   For the next 24 hours you can use MyChart to ask questions about today's visit, request a non-urgent call back, or ask for a work or school excuse. You will get an email in the next two days asking about your experience. I hope that your e-visit has been valuable and will speed your  recovery.   I have spent 5 minutes in review of e-visit questionnaire, review and updating patient chart, medical decision making and response to patient.   Margaretann Loveless, [name]

## 2023-09-24 ENCOUNTER — Encounter: Admitting: Nurse Practitioner

## 2023-09-24 DIAGNOSIS — L309 Dermatitis, unspecified: Secondary | ICD-10-CM

## 2023-09-24 NOTE — Progress Notes (Signed)
 I have spent 5 minutes in review of e-visit questionnaire, review and updating patient chart, medical decision making and response to patient.   Claiborne Rigg, NP

## 2023-09-24 NOTE — Progress Notes (Signed)
 Because this is a chronic dermatological issue and you may need to see a dermatologist, I feel your condition warrants further evaluation and I recommend that you be seen for a face to face visit.  Please contact your primary care physician practice to be seen. Many offices offer virtual options to be seen via video if you are not comfortable going in person to a medical facility at this time.  NOTE: You will NOT be charged for this eVisit.  If you do not have a PCP, [facility] offers a free physician referral service available at [phone]. Our trained staff has the experience, knowledge and resources to put you in touch with a physician who is right for you.    If you are having a true medical emergency please call 911.   Your e-visit answers were reviewed by a board certified advanced clinical practitioner to complete your personal care plan.  Thank you for using e-Visits.

## 2023-10-05 ENCOUNTER — Telehealth

## 2023-10-05 ENCOUNTER — Telehealth: Admitting: Family Medicine

## 2023-10-05 DIAGNOSIS — L309 Dermatitis, unspecified: Secondary | ICD-10-CM | POA: Diagnosis not present

## 2023-10-05 DIAGNOSIS — R051 Acute cough: Secondary | ICD-10-CM

## 2023-10-05 MED ORDER — PROMETHAZINE-DM 6.25-15 MG/5ML PO SYRP
5.0000 mL | ORAL_SOLUTION | Freq: Three times a day (TID) | ORAL | 0 refills | Status: AC | PRN
Start: 2023-10-05 — End: ?

## 2023-10-05 MED ORDER — MOMETASONE FUROATE 0.1 % EX OINT: TOPICAL_OINTMENT | Freq: Every day | CUTANEOUS | 0 refills | Status: AC

## 2023-10-05 MED ORDER — PREDNISONE 10 MG (21) PO TBPK: ORAL_TABLET | ORAL | 0 refills | Status: AC

## 2023-10-05 NOTE — Progress Notes (Signed)
 Virtual Visit Consent   NEVAYAH FAUST, you are scheduled for a virtual visit with a [facility] provider today. Just as with appointments in the office, your consent must be obtained to participate. Your consent will be active for this visit and any virtual visit you may have with one of our providers in the next 365 days. If you have a MyChart account, a copy of this consent can be sent to you electronically.  As this is a virtual visit, video technology does not allow for your provider to perform a traditional examination. This may limit your provider's ability to fully assess your condition. If your provider identifies any concerns that need to be evaluated in person or the need to arrange testing (such as labs, EKG, etc.), we will make arrangements to do so. Although advances in technology are sophisticated, we cannot ensure that it will always work on either your end or our end. If the connection with a video visit is poor, the visit may have to be switched to a telephone visit. With either a video or telephone visit, we are not always able to ensure that we have a secure connection.  By engaging in this virtual visit, you consent to the provision of healthcare and authorize for your insurance to be billed (if applicable) for the services provided during this visit. Depending on your insurance coverage, you may receive a charge related to this service.  I need to obtain your verbal consent now. Are you willing to proceed with your visit today? Kathy [name] has provided verbal consent on 10/05/2023 for a virtual visit (video or telephone). Loa Lamp, FNP  Date: 10/05/2023 12:56 PM   Virtual Visit via Video Note   I, Loa Lamp, connected with  Kathy [name]  ([id number], 24-Jan-1996) on 10/05/23 at 12:45 PM EDT by a video-enabled telemedicine application and verified that I am speaking with the correct person using two identifiers.  Location: Patient: Virtual Visit Location Patient:  Home Provider: Virtual Visit Location Provider: Home Office   I discussed the limitations of evaluation and management by telemedicine and the availability of in person appointments. The patient expressed understanding and agreed to proceed.    History of Present Illness: Kathy [name] is a 28 y.o. who identifies as a female who was assigned female at birth, and is being seen today for persistent cough- dry, worse at night, no wheezing sob or fever. She took a prednisone  pack and it relieved it then slept near a fan and it started again. In no distress. SABRA  HPI: HPI  Problems:  Patient Active Problem List   Diagnosis Date Noted   Hypertension 07/20/2021   Polymenorrhea 12/02/2018   Family history of breast cancer 03/31/2016   Menstrual cramps 03/31/2016   Binge eating 07/23/2015   Depression 07/23/2015   Eczema 06/22/2012   ELEVATED BLOOD PRESSURE 04/06/2007   Morbid obesity (HCC) 12/28/2006    Allergies:  Allergies  Allergen Reactions   Mushroom Extract Complex (Obsolete)     Throat itching   Medications:  Current Outpatient Medications:    mometasone  (ELOCON ) 0.1 % ointment, Apply topically daily., Disp: 60 g, Rfl: 0   albuterol  (VENTOLIN  HFA) 108 (90 Base) MCG/ACT inhaler, Inhale 4 puffs into the lungs every 4 (four) hours as needed for wheezing or shortness of breath., Disp: 18 g, Rfl: 0   benzonatate  (TESSALON ) 100 MG capsule, Take 1 capsule (100 mg total) by mouth 3 (three) times daily as needed for cough.,  Disp: 30 capsule, Rfl: 0   benzonatate  (TESSALON ) 100 MG capsule, Take 1 capsule (100 mg total) by mouth 3 (three) times daily as needed., Disp: 30 capsule, Rfl: 0   EPINEPHrine  0.3 mg/0.3 mL IJ SOAJ injection, Inject 0.3 mLs (0.3 mg total) into the muscle as needed for anaphylaxis., Disp: 1 each, Rfl: 1   fluticasone  (FLONASE ) 50 MCG/ACT nasal spray, Place 2 sprays into both nostrils daily., Disp: 16 g, Rfl: 6   hydrochlorothiazide  (MICROZIDE ) 12.5 MG capsule, Take 1  capsule (12.5 mg total) by mouth daily., Disp: 90 capsule, Rfl: 0   ibuprofen  (ADVIL ) 800 MG tablet, Take 1 tablet (800 mg total) by mouth 3 (three) times daily. (Patient taking differently: Take 800 mg by mouth 3 (three) times daily as needed.), Disp: 30 tablet, Rfl: 0   medroxyPROGESTERone  (PROVERA ) 10 MG tablet, TAKE 1 TABLET(10 MG) BY MOUTH DAILY, Disp: 20 tablet, Rfl: 0   ondansetron  (ZOFRAN -ODT) 4 MG disintegrating tablet, Take 1 tablet (4 mg total) by mouth every 8 (eight) hours as needed., Disp: 20 tablet, Rfl: 0   predniSONE  (STERAPRED UNI-PAK 21 TAB) 10 MG (21) TBPK tablet, 6 day taper; take as directed on package instructions, Disp: 21 tablet, Rfl: 0   promethazine -dextromethorphan (PROMETHAZINE -DM) 6.25-15 MG/5ML syrup, Take 5 mLs by mouth 3 (three) times daily as needed for cough., Disp: 200 mL, Rfl: 0   Semaglutide -Weight Management (WEGOVY ) 0.25 MG/0.5ML SOAJ, Inject 0.25 mg into the skin once a week., Disp: 2 mL, Rfl: 1   SUMAtriptan  (IMITREX ) 25 MG tablet, Take 1 tablet (25 mg total) by mouth every 2 (two) hours as needed for migraine. May repeat in 2 hours if headache persists or recurs., Disp: 10 tablet, Rfl: 0  Observations/Objective: Patient is well-developed, well-nourished in no acute distress.  Resting comfortably  at home.  Head is normocephalic, atraumatic.  No labored breathing.  Speech is clear and coherent with logical content.  Patient is alert and oriented at baseline.    Assessment and Plan: 1. Acute cough (Primary) - predniSONE  (STERAPRED UNI-PAK 21 TAB) 10 MG (21) TBPK tablet; 6 day taper; take as directed on package instructions  Dispense: 21 tablet; Refill: 0  2. Eczema, unspecified type  Increase fluids, humidifier at night, UC if sx persist or worsen for cxr.   Follow Up Instructions: I discussed the assessment and treatment plan with the patient. The patient was provided an opportunity to ask questions and all were answered. The patient agreed with  the plan and demonstrated an understanding of the instructions.  A copy of instructions were sent to the patient via MyChart unless otherwise noted below.     The patient was advised to call back or seek an in-person evaluation if the symptoms worsen or if the condition fails to improve as anticipated.    Jouri Threat, FNP

## 2023-10-05 NOTE — Progress Notes (Signed)
 Pt did not show for visit [facility]

## 2023-10-05 NOTE — Patient Instructions (Signed)

## 2023-10-10 ENCOUNTER — Encounter: Admitting: Family Medicine

## 2023-10-14 ENCOUNTER — Telehealth: Admitting: Family Medicine

## 2023-10-14 DIAGNOSIS — A084 Viral intestinal infection, unspecified: Secondary | ICD-10-CM | POA: Diagnosis not present

## 2023-10-14 MED ORDER — PROMETHAZINE HCL 25 MG PO TABS: 25.0000 mg | ORAL_TABLET | Freq: Three times a day (TID) | ORAL | 0 refills | Status: AC | PRN

## 2023-10-14 NOTE — Progress Notes (Signed)
 Virtual Visit Consent   Kathy [name], you are scheduled for a virtual visit with a [facility] provider today. Just as with appointments in the office, your consent must be obtained to participate. Your consent will be active for this visit and any virtual visit you may have with one of our providers in the next 365 days. If you have a MyChart account, a copy of this consent can be sent to you electronically.  As this is a virtual visit, video technology does not allow for your provider to perform a traditional examination. This may limit your provider's ability to fully assess your condition. If your provider identifies any concerns that need to be evaluated in person or the need to arrange testing (such as labs, EKG, etc.), we will make arrangements to do so. Although advances in technology are sophisticated, we cannot ensure that it will always work on either your end or our end. If the connection with a video visit is poor, the visit may have to be switched to a telephone visit. With either a video or telephone visit, we are not always able to ensure that we have a secure connection.  By engaging in this virtual visit, you consent to the provision of healthcare and authorize for your insurance to be billed (if applicable) for the services provided during this visit. Depending on your insurance coverage, you may receive a charge related to this service.  I need to obtain your verbal consent now. Are you willing to proceed with your visit today? Kathy [name] has provided verbal consent on 10/14/2023 for a virtual visit (video or telephone). Loa Lamp, FNP  Date: 10/14/2023 12:22 PM   Virtual Visit via Video Note   I, Loa Lamp, connected with  Kathy [name]  ([id number], [birth year]) on 10/14/23 at 12:15 PM EDT by a video-enabled telemedicine application and verified that I am speaking with the correct person using two identifiers.  Location: Patient: Virtual Visit Location Patient:  Home Provider: Virtual Visit Location Provider: Home Office   I discussed the limitations of evaluation and management by telemedicine and the availability of in person appointments. The patient expressed understanding and agreed to proceed.    History of Present Illness: Kathy [name] is a 28 y.o. who identifies as a female who was assigned female at birth, and is being seen today for a stomach virus. Started almost a week ago. Diarrhea, abd cramps. Nausea and vomiting. Persistent.   HPI: HPI  Problems:  Patient Active Problem List   Diagnosis Date Noted   Hypertension 07/20/2021   Polymenorrhea 12/02/2018   Family history of breast cancer 03/31/2016   Menstrual cramps 03/31/2016   Binge eating 07/23/2015   Depression 07/23/2015   Eczema 06/22/2012   ELEVATED BLOOD PRESSURE 04/06/2007   Morbid obesity (HCC) 12/28/2006    Allergies:  Allergies  Allergen Reactions   Mushroom Extract Complex (Obsolete)     Throat itching   Medications:  Current Outpatient Medications:    promethazine  (PHENERGAN ) 25 MG tablet, Take 1 tablet (25 mg total) by mouth every 8 (eight) hours as needed for nausea or vomiting., Disp: 20 tablet, Rfl: 0   albuterol  (VENTOLIN  HFA) 108 (90 Base) MCG/ACT inhaler, Inhale 4 puffs into the lungs every 4 (four) hours as needed for wheezing or shortness of breath., Disp: 18 g, Rfl: 0   benzonatate  (TESSALON ) 100 MG capsule, Take 1 capsule (100 mg total) by mouth 3 (three) times daily as needed for cough., Disp: 30  capsule, Rfl: 0   benzonatate  (TESSALON ) 100 MG capsule, Take 1 capsule (100 mg total) by mouth 3 (three) times daily as needed., Disp: 30 capsule, Rfl: 0   EPINEPHrine  0.3 mg/0.3 mL IJ SOAJ injection, Inject 0.3 mLs (0.3 mg total) into the muscle as needed for anaphylaxis., Disp: 1 each, Rfl: 1   fluticasone  (FLONASE ) 50 MCG/ACT nasal spray, Place 2 sprays into both nostrils daily., Disp: 16 g, Rfl: 6   hydrochlorothiazide  (MICROZIDE ) 12.5 MG capsule, Take  1 capsule (12.5 mg total) by mouth daily., Disp: 90 capsule, Rfl: 0   ibuprofen  (ADVIL ) 800 MG tablet, Take 1 tablet (800 mg total) by mouth 3 (three) times daily. (Patient taking differently: Take 800 mg by mouth 3 (three) times daily as needed.), Disp: 30 tablet, Rfl: 0   medroxyPROGESTERone  (PROVERA ) 10 MG tablet, TAKE 1 TABLET(10 MG) BY MOUTH DAILY, Disp: 20 tablet, Rfl: 0   mometasone  (ELOCON ) 0.1 % ointment, Apply topically daily., Disp: 60 g, Rfl: 0   ondansetron  (ZOFRAN -ODT) 4 MG disintegrating tablet, Take 1 tablet (4 mg total) by mouth every 8 (eight) hours as needed., Disp: 20 tablet, Rfl: 0   predniSONE  (STERAPRED UNI-PAK 21 TAB) 10 MG (21) TBPK tablet, 6 day taper; take as directed on package instructions, Disp: 21 tablet, Rfl: 0   promethazine -dextromethorphan (PROMETHAZINE -DM) 6.25-15 MG/5ML syrup, Take 5 mLs by mouth 3 (three) times daily as needed for cough., Disp: 200 mL, Rfl: 0   Semaglutide -Weight Management (WEGOVY ) 0.25 MG/0.5ML SOAJ, Inject 0.25 mg into the skin once a week., Disp: 2 mL, Rfl: 1   SUMAtriptan  (IMITREX ) 25 MG tablet, Take 1 tablet (25 mg total) by mouth every 2 (two) hours as needed for migraine. May repeat in 2 hours if headache persists or recurs., Disp: 10 tablet, Rfl: 0  Observations/Objective: Patient is well-developed, well-nourished in no acute distress.  Resting comfortably  at home.  Head is normocephalic, atraumatic.  No labored breathing.  Speech is clear and coherent with logical content.  Patient is alert and oriented at baseline.    Assessment and Plan: 1. Viral gastroenteritis (Primary)  Increase fluids, no milk or dairy, uc as needed if sx persist or worsen.   Follow Up Instructions: I discussed the assessment and treatment plan with the patient. The patient was provided an opportunity to ask questions and all were answered. The patient agreed with the plan and demonstrated an understanding of the instructions.  A copy of instructions  were sent to the patient via MyChart unless otherwise noted below.     The patient was advised to call back or seek an in-person evaluation if the symptoms worsen or if the condition fails to improve as anticipated.    Jeiden Daughtridge, FNP

## 2023-10-14 NOTE — Patient Instructions (Signed)

## 2023-11-28 ENCOUNTER — Telehealth: Payer: No Typology Code available for payment source
